# Patient Record
Sex: Female | Born: 1940 | Race: White | Hispanic: No | Smoking: Never smoker
Health system: Southern US, Community
[De-identification: ages and names within clinical notes are randomized; demographics above are authoritative.]

## PROBLEM LIST (undated history)

## (undated) DIAGNOSIS — K219 Gastro-esophageal reflux disease without esophagitis: Secondary | ICD-10-CM

## (undated) DIAGNOSIS — I499 Cardiac arrhythmia, unspecified: Secondary | ICD-10-CM

## (undated) DIAGNOSIS — E1165 Type 2 diabetes mellitus with hyperglycemia: Secondary | ICD-10-CM

## (undated) DIAGNOSIS — N816 Rectocele: Secondary | ICD-10-CM

## (undated) DIAGNOSIS — C801 Malignant (primary) neoplasm, unspecified: Secondary | ICD-10-CM

## (undated) DIAGNOSIS — M1990 Unspecified osteoarthritis, unspecified site: Secondary | ICD-10-CM

## (undated) DIAGNOSIS — I10 Essential (primary) hypertension: Secondary | ICD-10-CM

## (undated) HISTORY — PX: PUNCH BIOPSY OF SKIN: SHX6390

## (undated) HISTORY — PX: LAPAROSCOPIC HYSTERECTOMY: SHX1926

## (undated) HISTORY — PX: CHOLECYSTECTOMY: SHX55

## (undated) HISTORY — PX: TUBAL LIGATION: SHX77

## (undated) HISTORY — PX: CATARACT EXTRACTION, BILATERAL: SHX1313

## (undated) HISTORY — PX: ABDOMINAL HYSTERECTOMY: SHX81

## (undated) HISTORY — PX: APPENDECTOMY: SHX54

## (undated) HISTORY — DX: Type 2 diabetes mellitus with hyperglycemia: E11.65

---

## 1968-05-12 HISTORY — PX: APPENDECTOMY: SHX54

## 1976-05-12 HISTORY — PX: CHOLECYSTECTOMY: SHX55

## 1978-05-12 HISTORY — PX: TUBAL LIGATION: SHX77

## 2002-05-12 HISTORY — PX: CATARACT EXTRACTION, BILATERAL: SHX1313

## 2009-11-22 ENCOUNTER — Ambulatory Visit: Payer: Self-pay | Admitting: Internal Medicine

## 2009-11-29 ENCOUNTER — Ambulatory Visit: Payer: Self-pay | Admitting: Internal Medicine

## 2010-01-30 ENCOUNTER — Ambulatory Visit: Payer: Self-pay | Admitting: Unknown Physician Specialty

## 2010-12-02 ENCOUNTER — Ambulatory Visit: Payer: Self-pay | Admitting: Internal Medicine

## 2013-08-02 DIAGNOSIS — C44310 Basal cell carcinoma of skin of unspecified parts of face: Secondary | ICD-10-CM | POA: Insufficient documentation

## 2017-05-12 DIAGNOSIS — N816 Rectocele: Secondary | ICD-10-CM

## 2017-05-12 HISTORY — DX: Rectocele: N81.6

## 2017-06-29 ENCOUNTER — Telehealth: Payer: Self-pay | Admitting: Family Medicine

## 2017-06-29 NOTE — Telephone Encounter (Signed)
Per chart, pt contacted office and scheduled 11min appt

## 2017-06-29 NOTE — Telephone Encounter (Signed)
Lm on pts vm requesting a call back to schedule 62min appt

## 2017-06-29 NOTE — Telephone Encounter (Signed)
Attempted to reach patient. Left a message asking her to call office.

## 2017-06-29 NOTE — Telephone Encounter (Signed)
Please call patient and schedule her for a 30 minute appointment this week, have her keep her establish care appointment for next month.

## 2017-06-29 NOTE — Telephone Encounter (Signed)
Pt came in to schedule new patient appt. The first thing I was able to schedule is 3/8. She says her insurance sent out a nurse to give her a check up and says she has an irregular heartbeat. She wants her to see someone as soon as possible. Is there any way we could get her in sooner?

## 2017-07-01 ENCOUNTER — Ambulatory Visit (INDEPENDENT_AMBULATORY_CARE_PROVIDER_SITE_OTHER): Payer: Medicare Other | Admitting: Family Medicine

## 2017-07-01 ENCOUNTER — Encounter: Payer: Self-pay | Admitting: Family Medicine

## 2017-07-01 VITALS — BP 122/76 | HR 95 | Temp 98.0°F | Wt 170.5 lb

## 2017-07-01 DIAGNOSIS — E559 Vitamin D deficiency, unspecified: Secondary | ICD-10-CM

## 2017-07-01 DIAGNOSIS — Z23 Encounter for immunization: Secondary | ICD-10-CM | POA: Diagnosis not present

## 2017-07-01 DIAGNOSIS — Z7689 Persons encountering health services in other specified circumstances: Secondary | ICD-10-CM

## 2017-07-01 DIAGNOSIS — I499 Cardiac arrhythmia, unspecified: Secondary | ICD-10-CM

## 2017-07-01 NOTE — Progress Notes (Signed)
Subjective:    Patient ID: Claire Drake, female    DOB: [birth year], 77 y.o.   MRN: [id number]  HPI This is a 77 yo female who presents today to establish care. No regular care for 7-8 years.  She is a widow and lives alone. 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 Retired in 2005- worked at [employer]. Volunteers in [employer]. Involved in church. Picks up 26 yo grandson twice a week.   She has not had any care in many years. Presents today because she had a home nurse visit from [facility] who told her that her heart beat was irregular. She has not had any chest pain, SOB, she walks several miles while volunteering each week without difficulty. Since being told she has an irregular heart beat, she thinks she has noticed some skipped beats occasionally.   [facility] nurse also told her that her LE circulation was decreased following a test. Patient denies any numbness, tingling, pain, fatigue, swelling, redness, temperature change in her legs.   Last CPE- many years Mammo- declines screening due to age Pap- NA Colonoscopy- NA Tdap- unknown Flu- [facility] at [facility] center Eye- not recently Dental- regular Exercise- yard work, some walking    No past medical history on file.  Past Surgical History:  Procedure Laterality Date  . APPENDECTOMY    . CHOLECYSTECTOMY     Family History  Problem Relation Age of Onset  . Dementia Mother   . Heart disease Father   . Heart attack Father   . Diabetes Maternal Grandmother   . Heart attack Maternal Grandfather    Social History   Tobacco Use  . Smoking status: Never Smoker  . Smokeless tobacco: Never Used  Substance Use Topics  . Alcohol use: No    Frequency: Never  . Drug use: No        Review of Systems  Constitutional: Negative for fever.  Respiratory: Negative for cough, chest tightness and shortness of breath.   Cardiovascular: Positive for  palpitations (Thinks she has noticed since she was told her HR was irregular). Negative for chest pain and leg swelling.  Genitourinary: Positive for vaginal discharge (small amount clear). Negative for vaginal bleeding.  Neurological: Negative for dizziness and light-headedness.       Objective:   Physical Exam  Constitutional: She is oriented to person, place, and time. No distress.  HENT:  Head: Atraumatic.  Mouth/Throat: Oropharynx is clear and moist.  Eyes: Conjunctivae are normal.  Neck: Normal range of motion. Neck supple. No thyromegaly present.  Cardiovascular: Normal rate and normal heart sounds. An irregularly irregular rhythm present.  Pulmonary/Chest: Effort normal and breath sounds normal.  Musculoskeletal: Normal range of motion. She exhibits no edema.  Lymphadenopathy:    She has no cervical adenopathy.  Neurological: She is alert and oriented to person, place, and time.  Skin: Skin is warm and dry. She is not diaphoretic.  Psychiatric: She has a normal mood and affect. Her behavior is normal. Judgment and thought content normal.  Vitals reviewed.     BP 122/76   Pulse 95   Temp 98 F (36.7 C) (Oral)   Wt 170 lb 8 oz (77.3 kg)   SpO2 99%     EKG- reviewed with Dr. Danise Mina- SR rate 78 with PAC x 2   Assessment & Plan:  1. Encounter to establish care - Will follow up in  1 month for CPE/AWV  2. Irregular heart beat - will check labs, RTC/ER precautions reviewed - EKG 12-Lead - CBC with Differential - Comprehensive metabolic panel - TSH - VITAMIN D 25 Hydroxy (Vit-D Deficiency, Fractures) - HgB A1c - POCT UA - Microscopic Only - Magnesium  3. Need for vaccination with 13-polyvalent pneumococcal conjugate vaccine - Pneumococcal conjugate vaccine 13-valent   Clarene Reamer, FNP-BC  [place] [facility], [facility]  07/09/2017 1:15 PM

## 2017-07-01 NOTE — Patient Instructions (Signed)
It was good to meet you today.  Please schedule your complete physical exam for 1 month  I will call you with your lab results  If you have any concerns in the meantime, please give me a call

## 2017-07-02 LAB — COMPREHENSIVE METABOLIC PANEL
ALT: 15 U/L (ref 0–35)
AST: 18 U/L (ref 0–37)
Albumin: 4.2 g/dL (ref 3.5–5.2)
Alkaline Phosphatase: 70 U/L (ref 39–117)
BUN: 11 mg/dL (ref 6–23)
CO2: 30 meq/L (ref 19–32)
Calcium: 9.9 mg/dL (ref 8.4–10.5)
Chloride: 101 mEq/L (ref 96–112)
Creatinine, Ser: 0.69 mg/dL (ref 0.40–1.20)
GFR: 87.5 mL/min (ref >60.00)
GLUCOSE: 166 mg/dL — AB (ref 70–99)
POTASSIUM: 4.2 meq/L (ref 3.5–5.1)
Sodium: 138 mEq/L (ref 135–145)
TOTAL PROTEIN: 7 g/dL (ref 6.0–8.3)
Total Bilirubin: 0.5 mg/dL (ref 0.2–1.2)

## 2017-07-02 LAB — CBC WITH DIFFERENTIAL/PLATELET
Basophils Absolute: 0.1 10*3/uL (ref 0.0–0.1)
Basophils Relative: 0.9 % (ref 0.0–3.0)
EOS PCT: 2.8 % (ref 0.0–5.0)
Eosinophils Absolute: 0.2 10*3/uL (ref 0.0–0.7)
HCT: 46 % (ref 36.0–46.0)
Hemoglobin: 15.4 g/dL — ABNORMAL HIGH (ref 12.0–15.0)
LYMPHS ABS: 1.4 10*3/uL (ref 0.7–4.0)
Lymphocytes Relative: 18.9 % (ref 12.0–46.0)
MCHC: 33.5 g/dL (ref 30.0–36.0)
MCV: 94.1 fl (ref 78.0–100.0)
MONOS PCT: 8.1 % (ref 3.0–12.0)
Monocytes Absolute: 0.6 10*3/uL (ref 0.1–1.0)
NEUTROS ABS: 5.2 10*3/uL (ref 1.4–7.7)
NEUTROS PCT: 69.3 % (ref 43.0–77.0)
PLATELETS: 226 10*3/uL (ref 150.0–400.0)
RBC: 4.89 Mil/uL (ref 3.87–5.11)
RDW: 13 % (ref 11.5–15.5)
WBC: 7.4 10*3/uL (ref 4.0–10.5)

## 2017-07-02 LAB — VITAMIN D 25 HYDROXY (VIT D DEFICIENCY, FRACTURES): VITD: 16.86 ng/mL — ABNORMAL LOW (ref 30.00–100.00)

## 2017-07-02 LAB — MAGNESIUM: Magnesium: 1.8 mg/dL (ref 1.5–2.5)

## 2017-07-02 LAB — TSH: TSH: 2.28 u[IU]/mL (ref 0.35–4.50)

## 2017-07-09 ENCOUNTER — Encounter: Payer: Self-pay | Admitting: Family Medicine

## 2017-07-09 MED ORDER — VITAMIN D3 1.25 MG (50000 UT) PO TABS: 1.0000 | ORAL_TABLET | ORAL | 1 refills | Status: DC

## 2017-07-17 ENCOUNTER — Ambulatory Visit: Payer: Self-pay | Admitting: Family Medicine

## 2017-07-27 ENCOUNTER — Other Ambulatory Visit: Payer: Self-pay | Admitting: Family Medicine

## 2017-07-27 DIAGNOSIS — R739 Hyperglycemia, unspecified: Secondary | ICD-10-CM

## 2017-07-27 DIAGNOSIS — R7309 Other abnormal glucose: Secondary | ICD-10-CM

## 2017-07-29 ENCOUNTER — Ambulatory Visit: Payer: Medicare Other

## 2017-07-29 ENCOUNTER — Ambulatory Visit (INDEPENDENT_AMBULATORY_CARE_PROVIDER_SITE_OTHER): Payer: Medicare Other

## 2017-07-29 VITALS — BP 118/82 | HR 67 | Temp 97.6°F | Ht 60.5 in | Wt 164.8 lb

## 2017-07-29 DIAGNOSIS — Z0000 Encounter for general adult medical examination without abnormal findings: Secondary | ICD-10-CM

## 2017-07-29 DIAGNOSIS — R739 Hyperglycemia, unspecified: Secondary | ICD-10-CM | POA: Diagnosis not present

## 2017-07-29 DIAGNOSIS — R7309 Other abnormal glucose: Secondary | ICD-10-CM | POA: Diagnosis not present

## 2017-07-29 LAB — URINALYSIS, ROUTINE W REFLEX MICROSCOPIC
BILIRUBIN URINE: NEGATIVE
HGB URINE DIPSTICK: NEGATIVE
Ketones, ur: 40 — AB
NITRITE: NEGATIVE
Specific Gravity, Urine: 1.02 (ref 1.000–1.030)
Total Protein, Urine: NEGATIVE
Urine Glucose: NEGATIVE
Urobilinogen, UA: 0.2 (ref 0.0–1.0)
pH: 5.5 (ref 5.0–8.0)

## 2017-07-29 LAB — HEMOGLOBIN A1C: Hgb A1c MFr Bld: 8.7 % — ABNORMAL HIGH (ref 4.6–6.5)

## 2017-07-29 NOTE — Progress Notes (Signed)
Subjective:   Claire Drake is a 77 y.o. female who presents for an Initial Medicare Annual Wellness Visit.  Review of Systems    N/A  Cardiac Risk Factors include: advanced age (>19men, >32 women);obesity (BMI >30kg/m2)     Objective:    Today's Vitals   07/29/17 1122  BP: 118/82  Pulse: 67  Temp: 97.6 F (36.4 C)  TempSrc: Oral  SpO2: 98%  Weight: 164 lb 12 oz (74.7 kg)  Height: 5' 0.5" (1.537 m)  PainSc: 0-No pain   Body mass index is 31.65 kg/m.  Advanced Directives 07/29/2017  Does Patient Have a Medical Advance Directive? Yes  Type of Paramedic of [place];Living will  Copy of [place] in Chart? No - copy requested    Current Medications (verified) Outpatient Encounter Medications as of 07/29/2017  Medication Sig  . aspirin 81 MG chewable tablet Chew by mouth daily.  . Cholecalciferol (VITAMIN D3) 50000 units TABS Take 1 tablet by mouth every 7 (seven) days.   No facility-administered encounter medications on file as of 07/29/2017.     Allergies (verified) Patient has no known allergies.   History: History reviewed. No pertinent past medical history. Past Surgical History:  Procedure Laterality Date  . APPENDECTOMY    . CATARACT EXTRACTION, BILATERAL  05/12/2002  . CHOLECYSTECTOMY     Family History  Problem Relation Age of Onset  . Dementia Mother   . Heart disease Father   . Heart attack Father   . Diabetes Maternal Grandmother   . Heart attack Maternal Grandfather    Social History   Socioeconomic History  . Marital status: Widowed    Spouse name: None  . Number of children: None  . Years of education: None  . Highest education level: None  Social Needs  . Financial resource strain: None  . Food insecurity - worry: None  . Food insecurity - inability: None  . Transportation needs - medical: None  . Transportation needs - non-medical: None  Occupational History  . None  Tobacco Use  .  Smoking status: Never Smoker  . Smokeless tobacco: Never Used  Substance and Sexual Activity  . Alcohol use: No    Frequency: Never  . Drug use: No  . Sexual activity: No  Other Topics Concern  . None  Social History Narrative  . None    Tobacco Counseling Counseling given: No   Clinical Intake:  Pre-visit preparation completed: Yes  Pain : No/denies pain Pain Score: 0-No pain     Nutritional Status: BMI > 30  Obese Nutritional Risks: None Diabetes: No  How often do you need to have someone help you when you read instructions, pamphlets, or other written materials from your doctor or pharmacy?: 1 - Never What is the last grade level you completed in school?: Masters degree + PhD courses  Interpreter Needed?: No  Comments: pt is a widow and lives alone Information entered by :: LPinson, LPN   Activities of Daily Living In your present state of health, do you have any difficulty performing the following activities: 07/29/2017  Hearing? N  Vision? N  Difficulty concentrating or making decisions? N  Walking or climbing stairs? N  Dressing or bathing? N  Doing errands, shopping? N  Preparing Food and eating ? N  Using the Toilet? N  In the past six months, have you accidently leaked urine? N  Do you have problems with loss of bowel control? N  Managing your Medications? N  Managing your Finances? N  Housekeeping or managing your Housekeeping? N  Some recent data might be hidden     Immunizations and Health Maintenance Immunization History  Administered Date(s) Administered  . Influenza,inj,Quad PF,6+ Mos 02/09/2017  . Pneumococcal Conjugate-13 07/01/2017   There are no preventive care reminders to display for this patient.  Patient Care Team: Elby Beck, FNP as PCP - General (Nurse Practitioner)  Indicate any recent Medical Services you may have received from other than [facility] providers in the past year (date may be approximate).     Assessment:    This is a routine wellness examination for [place].  Hearing/Vision screen  Hearing Screening   125Hz  250Hz  500Hz  1000Hz  2000Hz  3000Hz  4000Hz  6000Hz  8000Hz   Right ear:   40 0 40  0    Left ear:   40 0 40  0      Visual Acuity Screening   Right eye Left eye Both eyes  Without correction: 20/50-1 20/30 20/20  With correction:       Dietary issues and exercise activities discussed: Current Exercise Habits: Home exercise routine, Type of exercise: walking, Time (Minutes): 20, Frequency (Times/Week): 7, Weekly Exercise (Minutes/Week): 140, Intensity: Moderate, Exercise limited by: None identified  Goals    . Increase physical activity     Starting 07/29/2017, I will continue to walk for 20 minutes daily.      Depression Screen PHQ 2/9 Scores 07/29/2017 07/01/2017  PHQ - 2 Score 0 0  PHQ- 9 Score 0 -    Fall Risk Fall Risk  07/29/2017 07/01/2017  Falls in the past year? Yes Yes  Number falls in past yr: 2 or more 2 or more  Injury with Fall? Yes -   Cognitive Function: MMSE - Mini Mental State Exam 07/29/2017  Orientation to time 5  Orientation to Place 5  Registration 3  Attention/ Calculation 0  Recall 3  Language- name 2 objects 0  Language- repeat 1  Language- follow 3 step command 3  Language- read & follow direction 0  Write a sentence 0  Copy design 0  Total score 20     PLEASE NOTE: A Mini-Cog screen was completed. Maximum score is 20. A value of 0 denotes this part of [place] MMSE was not completed or the patient failed this part of the Mini-Cog screening.   Mini-Cog Screening Orientation to Time - Max 5 pts Orientation to Place - Max 5 pts Registration - Max 3 pts Recall - Max 3 pts Language Repeat - Max 1 pts Language Follow 3 Step Command - Max 3 pts     Screening Tests Health Maintenance  Topic Date Due  . TETANUS/TDAP  07/30/2018 (Originally 09/12/1959)  . PNA vac Low Risk Adult (2 of 2 - PPSV23) 07/01/2018  . INFLUENZA VACCINE  Completed  . DEXA  SCAN  Completed      Plan:     I have personally reviewed, addressed, and noted the following in the patient's chart:  A. Medical and social history B. Use of alcohol, tobacco or illicit drugs  C. Current medications and supplements D. Functional ability and status E.  Nutritional status F.  Physical activity G. Advance directives H. List of other physicians I.  Hospitalizations, surgeries, and ER visits in previous 12 months J.  [place] to include hearing, vision, cognitive, depression L. Referrals and appointments - none  In addition, I have reviewed and discussed with patient certain preventive  protocols, quality metrics, and best practice recommendations. A written personalized care plan for preventive services as well as general preventive health recommendations were provided to patient.  See attached scanned questionnaire for additional information.   Signed,   Lindell Noe, MHA, BS, LPN Health Coach

## 2017-07-29 NOTE — Progress Notes (Signed)
I reviewed health advisor's note, was available for consultation, and agree with documentation and plan.  

## 2017-07-29 NOTE — Progress Notes (Signed)
PCP notes:   Health maintenance:  Tetanus vaccine - postponed/insurance  Abnormal screenings:   Hearing - failed  Hearing Screening   125Hz  250Hz  500Hz  1000Hz  2000Hz  3000Hz  4000Hz  6000Hz  8000Hz   Right ear:   40 0 40  0    Left ear:   40 0 40  0     Fall risk - hx of mutliple falls Fall Risk  07/29/2017 07/01/2017  Falls in the past year? Yes Yes  Number falls in past yr: 2 or more 2 or more  Injury with Fall? Yes -   Patient concerns:   None  Nurse concerns:  None  Next PCP appt:   08/05/2017 @ 0830

## 2017-07-29 NOTE — Patient Instructions (Signed)
Claire Drake , Thank you for taking time to come for your Medicare Wellness Visit. I appreciate your ongoing commitment to your health goals. Please review the following plan we discussed and let me know if I can assist you in the future.   These are the goals we discussed: Goals    . Increase physical activity     Starting 07/29/2017, I will continue to walk for 20 minutes daily.       This is a list of the screening recommended for you and due dates:  Health Maintenance  Topic Date Due  . Tetanus Vaccine  07/30/2018*  . Pneumonia vaccines (2 of 2 - PPSV23) 07/01/2018  . Flu Shot  Completed  . DEXA scan (bone density measurement)  Completed  *Topic was postponed. The date shown is not the original due date.   Preventive Care for Adults  A healthy lifestyle and preventive care can promote health and wellness. Preventive health guidelines for adults include the following key practices.  . A routine yearly physical is a good way to check with your health care provider about your health and preventive screening. It is a chance to share any concerns and updates on your health and to receive a thorough exam.  . Visit your dentist for a routine exam and preventive care every 6 months. Brush your teeth twice a day and floss once a day. Good oral hygiene prevents tooth decay and gum disease.  . The frequency of eye exams is based on your age, health, family medical history, use  of contact lenses, and other factors. Follow your health care provider's recommendations for frequency of eye exams.  . Eat a healthy diet. Foods like vegetables, fruits, whole grains, low-fat dairy products, and lean protein foods contain the nutrients you need without too many calories. Decrease your intake of foods high in solid fats, added sugars, and salt. Eat the right amount of calories for you. Get information about a proper diet from your health care provider, if necessary.  . Regular physical exercise is one of  the most important things you can do for your health. Most adults should get at least 150 minutes of moderate-intensity exercise (any activity that increases your heart rate and causes you to sweat) each week. In addition, most adults need muscle-strengthening exercises on 2 or more days a week.  Silver Sneakers may be a benefit available to you. To determine eligibility, you may visit the website: www.silversneakers.com or contact program at 3642193924 Mon-Fri between 8AM-8PM.   . Maintain a healthy weight. The body mass index (BMI) is a screening tool to identify possible weight problems. It provides an estimate of body fat based on height and weight. Your health care provider can find your BMI and can help you achieve or maintain a healthy weight.   For adults 20 years and older: ? A BMI below 18.5 is considered underweight. ? A BMI of 18.5 to 24.9 is normal. ? A BMI of 25 to 29.9 is considered overweight. ? A BMI of 30 and above is considered obese.   . Maintain normal blood lipids and cholesterol levels by exercising and minimizing your intake of saturated fat. Eat a balanced diet with plenty of fruit and vegetables. Blood tests for lipids and cholesterol should begin at age 55 and be repeated every 5 years. If your lipid or cholesterol levels are high, you are over 50, or you are at high risk for heart disease, you may need your cholesterol levels  checked more frequently. Ongoing high lipid and cholesterol levels should be treated with medicines if diet and exercise are not working.  . If you smoke, find out from your health care provider how to quit. If you do not use tobacco, please do not start.  . If you choose to drink alcohol, please do not consume more than 2 drinks per day. One drink is considered to be 12 ounces (355 mL) of beer, 5 ounces (148 mL) of wine, or 1.5 ounces (44 mL) of liquor.  . If you are 36-16 years old, ask your health care provider if you should take aspirin to  prevent strokes.  . Use sunscreen. Apply sunscreen liberally and repeatedly throughout the day. You should seek shade when your shadow is shorter than you. Protect yourself by wearing long sleeves, pants, a wide-brimmed hat, and sunglasses year round, whenever you are outdoors.  . Once a month, do a whole body skin exam, using a mirror to look at the skin on your back. Tell your health care provider of new moles, moles that have irregular borders, moles that are larger than a pencil eraser, or moles that have changed in shape or color.

## 2017-07-30 LAB — LIPID PANEL
CHOL/HDL RATIO: 3
CHOLESTEROL: 184 mg/dL (ref 0–200)
HDL: 60.2 mg/dL (ref >39.00)
LDL Cholesterol: 107 mg/dL — ABNORMAL HIGH (ref 0–99)
NonHDL: 123.44
TRIGLYCERIDES: 81 mg/dL (ref 0.0–149.0)
VLDL: 16.2 mg/dL (ref 0.0–40.0)

## 2017-08-05 ENCOUNTER — Ambulatory Visit (INDEPENDENT_AMBULATORY_CARE_PROVIDER_SITE_OTHER): Payer: Medicare Other | Admitting: Family Medicine

## 2017-08-05 ENCOUNTER — Other Ambulatory Visit (HOSPITAL_COMMUNITY)
Admission: RE | Admit: 2017-08-05 | Discharge: 2017-08-05 | Disposition: A | Discharge status: Home / Self Care | Payer: Medicare Other | Source: Ambulatory Visit | Attending: Family Medicine | Admitting: Family Medicine

## 2017-08-05 ENCOUNTER — Encounter: Payer: Self-pay | Admitting: Family Medicine

## 2017-08-05 VITALS — BP 132/62 | HR 78 | Temp 97.7°F | Wt 168.8 lb

## 2017-08-05 DIAGNOSIS — Z0000 Encounter for general adult medical examination without abnormal findings: Secondary | ICD-10-CM | POA: Diagnosis not present

## 2017-08-05 DIAGNOSIS — Z124 Encounter for screening for malignant neoplasm of cervix: Secondary | ICD-10-CM

## 2017-08-05 DIAGNOSIS — E1165 Type 2 diabetes mellitus with hyperglycemia: Secondary | ICD-10-CM | POA: Diagnosis not present

## 2017-08-05 DIAGNOSIS — N898 Other specified noninflammatory disorders of vagina: Secondary | ICD-10-CM | POA: Insufficient documentation

## 2017-08-05 DIAGNOSIS — R8290 Unspecified abnormal findings in urine: Secondary | ICD-10-CM

## 2017-08-05 LAB — POC URINALSYSI DIPSTICK (AUTOMATED)
Bilirubin, UA: NEGATIVE
Blood, UA: NEGATIVE
Glucose, UA: NEGATIVE
KETONES UA: NEGATIVE
Leukocytes, UA: NEGATIVE
Nitrite, UA: NEGATIVE
PH UA: 6 (ref 5.0–8.0)
PROTEIN UA: POSITIVE
SPEC GRAV UA: 1.02 (ref 1.010–1.025)
Urobilinogen, UA: 0.2 E.U./dL

## 2017-08-05 MED ORDER — METFORMIN HCL 500 MG PO TABS: ORAL_TABLET | ORAL | 3 refills | Status: DC

## 2017-08-05 NOTE — Progress Notes (Signed)
Subjective:    Patient ID: Claire Drake, female    DOB: 1940/12/04, 77 y.o.   MRN: [id number]  HPI This is a 77 yo female who presents today for CPE, part 2. Had AWV 07/29/17. She volunteers at the [facility].   Decreased hearing on screening- not interfering with daily activities.   Diabetes- hemoglobin A1C 8.7, has been elevated in past. Did not watch her diet very carefully over the holidays. Is agreeable to starting medication. Has been increasing exercise recently.   Vaginal discharge- for about 5 months, not itchy, clear to white. Has to wear a pad daily. Not sexually active for many years. No abdominal pain, nausea, vomiting or change in bowel pattern.   Abnormal Urine on dip- + ketones, positive WBCs- no dysuria or frequency    Last CPE- many years Mammo- declines screening due to age 83 Colonoscopy- declines screening Tdap-unknown, declines due to finances Flu- annual Eye- over due Dental- regular Exercise- has recently increased frequency of walking to most days of the week   No past medical history on file. Past Surgical History:  Procedure Laterality Date  . APPENDECTOMY    . CATARACT EXTRACTION, BILATERAL  05/12/2002  . CHOLECYSTECTOMY     Family History  Problem Relation Age of Onset  . Dementia Mother   . Heart disease Father   . Heart attack Father   . Diabetes Maternal Grandmother   . Heart attack Maternal Grandfather    Social History   Tobacco Use  . Smoking status: Never Smoker  . Smokeless tobacco: Never Used  Substance Use Topics  . Alcohol use: No    Frequency: Never  . Drug use: No      Review of Systems  Constitutional: Negative.   HENT: Negative.   Eyes: Negative.   Cardiovascular: Negative.   Gastrointestinal: Negative.   Genitourinary: Positive for vaginal discharge. Negative for dysuria, frequency, pelvic pain, vaginal bleeding and vaginal pain.  Musculoskeletal:       Occasional pain and stiffness of knees, back.  Doesn't usually take anything for symptoms.   Allergic/Immunologic: Negative.   Neurological: Negative.   Psychiatric/Behavioral: Negative for dysphoric mood. The patient is not nervous/anxious.        Objective:   Physical Exam  Constitutional: She is oriented to person, place, and time. She appears well-developed and well-nourished. No distress.  HENT:  Head: Normocephalic and atraumatic.  Eyes: Conjunctivae are normal.  Neck: Normal range of motion. Neck supple.  Cardiovascular: Normal rate, regular rhythm, normal heart sounds and intact distal pulses.  Pulmonary/Chest: Effort normal and breath sounds normal. Right breast exhibits no inverted nipple, no mass, no nipple discharge, no skin change and no tenderness. Left breast exhibits no inverted nipple, no mass, no nipple discharge, no skin change and no tenderness.  Abdominal: Soft. Bowel sounds are normal. She exhibits no distension. There is no tenderness. There is no rebound and no guarding.  Genitourinary: Pelvic exam was performed with patient supine. There is no rash, tenderness, lesion or injury on the right labia. There is no rash, tenderness, lesion or injury on the left labia. Cervix exhibits discharge. Cervix exhibits no motion tenderness and no friability. Right adnexum displays no mass, no tenderness and no fullness. Left adnexum displays no mass, no tenderness and no fullness. Vaginal discharge found.  Genitourinary Comments: Patient with moderate amount clear mucoid discharge. Cervix slightly dilated (fingertip). PAP obtained.   Musculoskeletal: She exhibits no edema.  Lymphadenopathy:    She  has no cervical adenopathy.  Neurological: She is alert and oriented to person, place, and time.  Skin: Skin is warm and dry. She is not diaphoretic.  Psychiatric: She has a normal mood and affect. Her behavior is normal. Judgment and thought content normal.  Vitals reviewed.    BP 132/62   Pulse 78   Temp 97.7 F (36.5 C)  (Oral)   Wt 168 lb 12 oz (76.5 kg)   SpO2 99%   BMI 32.41 kg/m  Wt Readings from Last 3 Encounters:  08/05/17 168 lb 12 oz (76.5 kg)  07/29/17 164 lb 12 oz (74.7 kg)  07/01/17 170 lb 8 oz (77.3 kg)   Depression screen [facility] 2/9 07/29/2017 07/01/2017  Decreased Interest 0 0  Down, Depressed, Hopeless 0 0  PHQ - 2 Score 0 0  Altered sleeping 0 -  Tired, decreased energy 0 -  Change in appetite 0 -  Feeling bad or failure about yourself  0 -  Trouble concentrating 0 -  Moving slowly or fidgety/restless 0 -  Suicidal thoughts 0 -  PHQ-9 Score 0 -  Difficult doing work/chores Not difficult at all -        Assessment & Plan:  1. Annual physical exam - Discussed and encouraged healthy lifestyle choices- adequate sleep, regular exercise, stress management and healthy food choices.   2. Uncontrolled type 2 diabetes mellitus with hyperglycemia - Provided written and verbal information regarding diagnosis and treatment. - will start low dose metformin and discussed importance of healthy food choices for weight loss and increased exercise - metFORMIN (GLUCOPHAGE) 500 MG tablet; Take 1/2 tablet with dinner for 7 days, then take 1 tablet daily with dinner  Dispense: 90 tablet; Refill: 3  3. Vaginal discharge - Will check PAP and will likely need pelvic US - Cytology - PAP([facility])  4. Screening for cervical cancer - Cytology - PAP([place])  5. Abnormal urine - POCT Urinalysis Dipstick (Automated)   Clarene Reamer, FNP-BC   Primary Care at [facility], [facility]  08/06/2017 8:57 AM

## 2017-08-05 NOTE — Patient Instructions (Signed)
Good to see you today!  I have sent metformin to your pharmacy, start 1/2 tablet with dinner for 7 days then take a whole table. Let me know if you develop stomach problems or have questions.  I am sending pap smear to check for yeast, bacteria and abnormal cervical cells  Follow up in 3-4 months to check your labs     Keeping You Healthy  Get These Tests  Blood Pressure- Have your blood pressure checked by your healthcare provider at least once a year.  Normal blood pressure is 120/80.  Weight- Have your body mass index (BMI) calculated to screen for obesity.  BMI is a measure of body fat based on height and weight.  You can calculate your own BMI at [employer].it  Cholesterol- Have your cholesterol checked every year.  Diabetes- Have your blood sugar checked every year if you have high blood pressure, high cholesterol, a family history of diabetes or if you are overweight.  Pap Test - Have a pap test every 1 to 5 years if you have been sexually active.  If you are older than 65 and recent pap tests have been normal you may not need additional pap tests.  In addition, if you have had a hysterectomy  for benign disease additional pap tests are not necessary.  Mammogram-Yearly mammograms are essential for early detection of breast cancer  Screening for Colon Cancer- Colonoscopy starting at age 16. Screening may begin sooner depending on your family history and other health conditions.  Follow up colonoscopy as directed by your Gastroenterologist.  Screening for Osteoporosis- Screening begins at age 53 with bone density scanning, sooner if you are at higher risk for developing Osteoporosis.  Get these medicines  Calcium with Vitamin D- Your body requires 1200-1500 mg of Calcium a day and [phone] IU of Vitamin D a day.  You can only absorb 500 mg of Calcium at a time therefore Calcium must be taken in 2 or 3 separate doses throughout the day.  Hormones- Hormone therapy  has been associated with increased risk for certain cancers and heart disease.  Talk to your healthcare provider about if you need relief from menopausal symptoms.  Aspirin- Ask your healthcare provider about taking Aspirin to prevent Heart Disease and Stroke.  Get these Immuniztions  Flu shot- Every fall  Pneumonia shot- Once after the age of 46; if you are younger ask your healthcare provider if you need a pneumonia shot.  Tetanus- Every ten years.  Zostavax- Once after the age of 74 to prevent shingles.  Take these steps  Don't smoke- Your healthcare provider can help you quit. For tips on how to quit, ask your healthcare provider or go to www.smokefree.gov or call 1-800 QUIT-NOW.  Be physically active- Exercise 5 days a week for a minimum of 30 minutes.  If you are not already physically active, start slow and gradually work up to 30 minutes of moderate physical activity.  Try walking, dancing, bike riding, swimming, etc.  Eat a healthy diet- Eat a variety of healthy foods such as fruits, vegetables, whole grains, low fat milk, low fat cheeses, yogurt, lean meats, chicken, fish, eggs, dried beans, tofu, etc.  For more information go to www.thenutritionsource.org  Dental visit- Brush and floss teeth twice daily; visit your dentist twice a year.  Eye exam- Visit your Optometrist or Ophthalmologist yearly.  Drink alcohol in moderation- Limit alcohol intake to one drink or less a day.  Never drink and drive.  Depression- Your  emotional health is as important as your physical health.  If you're feeling down or losing interest in things you normally enjoy, please talk to your healthcare provider.  Seat Belts- can save your life; always wear one  Smoke/Carbon Monoxide detectors- These detectors need to be installed on the appropriate level of your home.  Replace batteries at least once a year.  Violence- If anyone is threatening or hurting you, please tell your healthcare  provider.  Living Will/ Health care power of attorney- Discuss with your healthcare provider and family.

## 2017-08-06 ENCOUNTER — Encounter: Payer: Self-pay | Admitting: Family Medicine

## 2017-08-06 DIAGNOSIS — E1165 Type 2 diabetes mellitus with hyperglycemia: Secondary | ICD-10-CM | POA: Insufficient documentation

## 2017-08-06 HISTORY — DX: Type 2 diabetes mellitus with hyperglycemia: E11.65

## 2017-08-07 LAB — CYTOLOGY - PAP
BACTERIAL VAGINITIS: NEGATIVE
CANDIDA VAGINITIS: NEGATIVE
DIAGNOSIS: NEGATIVE
HPV: NOT DETECTED

## 2017-08-10 DIAGNOSIS — I10 Essential (primary) hypertension: Secondary | ICD-10-CM

## 2017-08-10 DIAGNOSIS — C801 Malignant (primary) neoplasm, unspecified: Secondary | ICD-10-CM

## 2017-08-10 HISTORY — DX: Malignant (primary) neoplasm, unspecified: C80.1

## 2017-08-10 HISTORY — DX: Essential (primary) hypertension: I10

## 2017-08-11 ENCOUNTER — Other Ambulatory Visit: Payer: Self-pay | Admitting: Family Medicine

## 2017-08-11 DIAGNOSIS — N898 Other specified noninflammatory disorders of vagina: Secondary | ICD-10-CM

## 2017-08-13 ENCOUNTER — Ambulatory Visit: Payer: Medicare Other | Admitting: Obstetrics and Gynecology

## 2017-08-13 ENCOUNTER — Encounter: Payer: Self-pay | Admitting: Obstetrics and Gynecology

## 2017-08-13 VITALS — BP 175/90 | HR 72 | Wt 168.0 lb

## 2017-08-13 DIAGNOSIS — N898 Other specified noninflammatory disorders of vagina: Secondary | ICD-10-CM | POA: Diagnosis not present

## 2017-08-13 DIAGNOSIS — N816 Rectocele: Secondary | ICD-10-CM | POA: Diagnosis not present

## 2017-08-13 DIAGNOSIS — R030 Elevated blood-pressure reading, without diagnosis of hypertension: Secondary | ICD-10-CM

## 2017-08-13 NOTE — Procedures (Signed)
Endometrial Biopsy Procedure Note  Pre-operative Diagnosis: Vaginal discharge. postmenopausal  Post-operative Diagnosis: same  Procedure Details  The risks (including infection, bleeding, pain, and uterine perforation) and benefits of the procedure were explained to the patient and Written informed consent was obtained.  The patient was placed in the dorsal lithotomy position.  Bimanual exam showed the uterus to be in the neutral position.  A Graves' speculum inserted in the vagina, and the cervix was visualized.. The cervix was then prepped with povidone iodine, and a sharp tenaculum was applied to the anterior lip of the cervix for stabilization.  A pipelle was inserted into the uterine cavity and sounded the uterus to a depth of 9cm.  A Large amount of tissue was collected after 3 passes; on the first pass, only the gelatinous material was obtained.  The sample was sent for pathologic examination.  Condition: Stable  Complications: None  Plan: The patient was advised to call for any fever or for prolonged or severe pain or bleeding. She was advised to use OTC analgesics as needed for mild to moderate pain. She was advised to avoid vaginal intercourse for 48 hours or until the bleeding has completely stopped.  Durene Romans MD Attending Center for [facility])

## 2017-08-13 NOTE — Progress Notes (Addendum)
Obstetrics and Gynecology New Patient Referral Evaluation  Appointment Date: 08/13/2017  OBGYN Clinic: Center for [facility]  Primary Care Provider: Elby Beck  Referring Provider: Elby Beck, FNP  Chief Complaint:  Chief Complaint  Patient presents with  . New Patient (Initial Visit)    vaginal itching/discharge    History of Present Illness: Claire Drake is a 77 y.o. Caucasian [id number] (LMP: early 62s), seen for the above chief complaint.   Five month history of vaginal discharge. Patient seen in late march for physical and had negative pap smear and was referred to [place] for evaluation.  She states she hasn't gotten worse but has persisted. Rarely sometimes has red streaks in it and has ?pain when she has streaks. She has discharge qday and has to use a liner a day. No prior history of this. No vaginal itching, dysuria, hematuria, blood in BMs, nausea, vomiting, abdominal pain.     Review of Systems:  as noted in the History of Present Illness.  Past Medical History:  No past medical history on file.  Past Surgical History:  Past Surgical History:  Procedure Laterality Date  . APPENDECTOMY    . CATARACT EXTRACTION, BILATERAL  05/12/2002  . CHOLECYSTECTOMY    . TUBAL LIGATION Bilateral     Past Obstetrical History:  OB History  Gravida Para Term Preterm AB Living  3 2 2   1 2   SAB TAB Ectopic Multiple Live Births  1       2    # Outcome Date GA Lbr Len/2nd Weight Sex Delivery Anes PTL Lv  3 SAB           2 Term           1 Term             Obstetric Comments  svd x 2. sab x 1    Past Gynecological History: As per HPI. History of Pap Smear(s): Yes.   Last pap 07/2017, which was NILM/HPV negative History of HRT use: no  Social History:  Social History   Socioeconomic History  . Marital status: Widowed    Spouse name: Not on file  . Number of children: Not on file  . Years of education: Not on file  . Highest education  level: Not on file  Occupational History  . Not on file  Social Needs  . Financial resource strain: Not on file  . Food insecurity:    Worry: Not on file    Inability: Not on file  . Transportation needs:    Medical: Not on file    Non-medical: Not on file  Tobacco Use  . Smoking status: Never Smoker  . Smokeless tobacco: Never Used  Substance and Sexual Activity  . Alcohol use: No    Frequency: Never  . Drug use: No  . Sexual activity: Never  Lifestyle  . Physical activity:    Days per week: Not on file    Minutes per session: Not on file  . Stress: Not on file  Relationships  . Social connections:    Talks on phone: Not on file    Gets together: Not on file    Attends religious service: Not on file    Active member of club or organization: Not on file    Attends meetings of clubs or organizations: Not on file    Relationship status: Not on file  . Intimate partner violence:    Fear of current  or ex partner: Not on file    Emotionally abused: Not on file    Physically abused: Not on file    Forced sexual activity: Not on file  Other Topics Concern  . Not on file  Social History Narrative  . Not on file    Family History:  Family History  Problem Relation Age of Onset  . Dementia Mother   . Heart disease Father   . Heart attack Father   . Diabetes Maternal Grandmother   . Heart attack Maternal Grandfather     Health Maintenance:  Colonoscopy: Yes.   Date: 2-3 years ago. Pt states it was normal  Medications [name] had no medications administered during this visit. Current Outpatient Medications  Medication Sig Dispense Refill  . aspirin 81 MG chewable tablet Chew by mouth daily.    . Cholecalciferol (VITAMIN D3) 50000 units TABS Take 1 tablet by mouth every 7 (seven) days. 12 tablet 1  . metFORMIN (GLUCOPHAGE) 500 MG tablet Take 1/2 tablet with dinner for 7 days, then take 1 tablet daily with dinner 90 tablet 3   No current facility-administered  medications for this visit.     Allergies Patient has no known allergies.   Physical Exam:  BP (!) 175/90   Pulse 72   Wt 168 lb (76.2 kg)   BMI 32.27 kg/m  Body mass index is 32.27 kg/m. General appearance: Well nourished, well developed female in no acute distress.  Neck:  Supple, normal appearance, and no thyromegaly  Cardiovascular: normal s1 and s2.  No murmurs, rubs or gallops. Respiratory:  Clear to auscultation bilateral. Normal respiratory effort Abdomen: positive bowel sounds and no masses, hernias; diffusely non tender to palpation, non distended. Neuro/Psych:  Normal mood and affect.  Skin:  Warm and dry.  Lymphatic:  No inguinal lymphadenopathy.   Pelvic exam: is not limited by body habitus External genitalia normal. Large genital hiatus (approx 5-6cm). Moderate amount of clear, gelatinous discharge all around the introitus. Large rectocele noted and approximately to -1 without valsalva. On speculum exam. Mild amount of same d/c, none actively coming out of os, no vaginal bleeding. Normal appearing cervix, nttp. Uterus:  enlarged: 8wk sized and non tender, mobile and Adnexa:  normal adnexa and no mass, fullness, tenderness Rectovaginal: negative  See procedure note for embx but moderate to large amounts of d/c material and bloody material and tissue removed.   Laboratory:  Wet prep: normal squamous epithelial cells noted  Radiology: none  Assessment: pt stable  Plan: pt stable 1. Vaginal discharge F/u final pathology. Will get u/s. D/w pt concerning picture and concern for malignancy. Will call with results - US PELVIC COMPLETE WITH TRANSVAGINAL; Future  2. HTN Patient states she has white coat HTN and they did come down some with her visit. inbasket message sent to her PCP. Pt asymptomatic and they have always been normal in the past.  RTC PRN  Durene Romans MD Attending Center for [facility])

## 2017-08-13 NOTE — Progress Notes (Signed)
Patient present to office today for recurring vaginal itching and discharge for several months.

## 2017-08-18 ENCOUNTER — Telehealth: Payer: Self-pay

## 2017-08-18 ENCOUNTER — Ambulatory Visit: Payer: Medicare Other

## 2017-08-18 ENCOUNTER — Telehealth: Payer: Self-pay | Admitting: Obstetrics and Gynecology

## 2017-08-18 DIAGNOSIS — Z8542 Personal history of malignant neoplasm of other parts of uterus: Secondary | ICD-10-CM | POA: Insufficient documentation

## 2017-08-18 DIAGNOSIS — C55 Malignant neoplasm of uterus, part unspecified: Secondary | ICD-10-CM | POA: Insufficient documentation

## 2017-08-18 NOTE — Telephone Encounter (Signed)
GYN Telephone Note D/w pt and she prefers [facility] CC referral. Will send for scheduling asap and cancel her u/s for today. Will send note to pcp   Durene Romans MD Attending Center for [facility] (Faculty Practice) 08/18/2017 Time: [phone]

## 2017-08-18 NOTE — Telephone Encounter (Signed)
GYN Telephone Note VM left at [phone] asking her to call me at the office. Will try again later.  Durene Romans MD Attending Center for [facility] (Faculty Practice) 08/18/2017 Time: [phone]

## 2017-08-18 NOTE — Telephone Encounter (Addendum)
Received call from Dr. Thomes Dinning office for urgent referral for new diagnosis of endometrial cancer. We can see Claire Drake 4/10 at 1330. Verified with her that her U/S has been cancelled. 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 Oncology Nurse Navigator Documentation  Navigator Location: [facility] (08/18/17 0900)   )Navigator Encounter Type: Telephone (08/18/17 0900) Telephone: Claire Drake Call;Incoming Call;Appt Confirmation/Clarification (08/18/17 0900)                                                  Time Spent with Patient: 15 (08/18/17 0900)

## 2017-08-19 ENCOUNTER — Other Ambulatory Visit: Payer: Self-pay | Admitting: *Deleted

## 2017-08-19 ENCOUNTER — Encounter (INDEPENDENT_AMBULATORY_CARE_PROVIDER_SITE_OTHER): Payer: Self-pay

## 2017-08-19 ENCOUNTER — Inpatient Hospital Stay: Payer: Medicare Other | Attending: Obstetrics and Gynecology | Admitting: Obstetrics and Gynecology

## 2017-08-19 ENCOUNTER — Telehealth: Payer: Self-pay | Admitting: Family Medicine

## 2017-08-19 VITALS — BP 194/121 | HR 89 | Temp 97.9°F | Resp 18 | Ht 60.5 in | Wt 169.0 lb

## 2017-08-19 DIAGNOSIS — I499 Cardiac arrhythmia, unspecified: Secondary | ICD-10-CM

## 2017-08-19 DIAGNOSIS — Z780 Asymptomatic menopausal state: Secondary | ICD-10-CM | POA: Insufficient documentation

## 2017-08-19 DIAGNOSIS — N816 Rectocele: Secondary | ICD-10-CM | POA: Insufficient documentation

## 2017-08-19 DIAGNOSIS — C541 Malignant neoplasm of endometrium: Secondary | ICD-10-CM | POA: Insufficient documentation

## 2017-08-19 NOTE — H&P (View-Only) (Signed)
Gynecologic Oncology Consult Visit   Referring Provider: Dr. Ilda Basset  Chief Complaint: Endometroid adenocarcinoma, grade 1  Subjective:  Claire Drake is a 77 y.o. [id number], post-menopausal, female who is seen in consultation from Clarene Reamer, FNP and Dr. Ilda Basset (Center [facility] at [facility]).   Initially, patient has 42-month history of vaginal discharge.  Last Pap 07/2017 was NILM, HPV negative.  No history of hormone replacement therapy.  On exam, moderate amount of clear, gelatinous discharge around introitus. Large rectocele noted and approximately to -1 w/o valsalva. On speculum, discharge again noted, none from os. No bleeding. Normal appearing cervix. Uterus: enlarged sized 8wk and nontender, mobile. Adnexa, normal w/o massess, fullness, or tenderness.   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  Today, patient reports overall feeling well. Denies bleeding or pain. She has been intentionally trying to lose weight. Continues to have moderate amounts of light colored vaginal discharge. Had mild intermittent pain and streaking blood after biopsy.   Problem List: Patient Active Problem List   Diagnosis Date Noted  . Uterine cancer ([place]) 08/18/2017  . Vaginal discharge 08/13/2017  . Transient hypertension 08/13/2017  . Uncontrolled type 2 diabetes mellitus with hyperglycemia ([place]) 08/06/2017  . 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 No pertinent past medical history.  Past Surgical History: Past Surgical History:  Procedure Laterality Date  . APPENDECTOMY    . CATARACT EXTRACTION, BILATERAL  05/12/2002  . CHOLECYSTECTOMY    . TUBAL LIGATION Bilateral     Past Gynecologic History:  G3P2. Denies abnormal pap smears. Tubal ligation in 1980s. Not currently sexually active. Denies history of STIs.  Denies HRT use.   OB History:  OB History  Gravida Para Term Preterm AB Living  3 2 2   1 2   SAB TAB Ectopic Multiple Live Births  1       2    # Outcome Date GA Lbr Len/2nd Weight Sex Delivery Anes PTL Lv  3 SAB           2 Term           1 Term             Obstetric Comments  svd x 2. sab x 1    Family History: Family History  Problem Relation Age of Onset  . Dementia Mother   . Heart disease Father   . Heart attack Father   . Diabetes Maternal Grandmother   . Heart attack Maternal Grandfather     Social History: Social History   Socioeconomic History  . Marital status: Widowed    Spouse name: Not on file  . Number of children: Not on file  . Years of education: Not on file  . Highest education level: Not on file  Occupational History  . Not on file  Social Needs  . Financial resource strain: Not on file  . Food insecurity:    Worry: Not on file    Inability: Not on file  . Transportation needs:    Medical: Not on file    Non-medical: Not on file  Tobacco Use  . Smoking status: Never Smoker  . Smokeless tobacco: Never Used  Substance and Sexual Activity  . Alcohol use: No    Frequency: Never  . Drug use: No  . Sexual activity: Never  Lifestyle  . Physical activity:  Days per week: Not on file    Minutes per session: Not on file  . Stress: Not on file  Relationships  . Social connections:    Talks on phone: Not on file    Gets together: Not on file    Attends religious service: Not on file    Active member of club or organization: Not on file    Attends meetings of clubs or organizations: Not on file    Relationship status: Not on file  . Intimate partner violence:    Fear of current or ex partner: Not on file    Emotionally abused: Not on file    Physically abused: Not on file    Forced sexual activity: Not on file  Other Topics Concern  . Not on file  Social History Narrative  . Not on file    Allergies: No Known  Allergies  Current Medications: Current Outpatient Medications  Medication Sig Dispense Refill  . aspirin 81 MG chewable tablet Chew by mouth daily.    . Cholecalciferol (VITAMIN D3) 50000 units TABS Take 1 tablet by mouth every 7 (seven) days. 12 tablet 1  . metFORMIN (GLUCOPHAGE) 500 MG tablet Take 1/2 tablet with dinner for 7 days, then take 1 tablet daily with dinner 90 tablet 3   No current facility-administered medications for this visit.     Review of Systems General: negative for, fevers, chills, fatigue, changes in sleep, changes in weight or appetite Skin: negative for changes in color, texture, moles or lesions Eyes: negative for, changes in vision, pain, diplopia HEENT: negative for, change in hearing, pain, discharge, tinnitus, vertigo, voice changes, sore throat, neck masses Breasts: negative for breast lumps Pulmonary: negative for, dyspnea, orthopnea, productive cough Cardiac: positive for irregular heart beat and palpitations, negative for, syncope, pain, discomfort, pressure Gastrointestinal: negative for, dysphagia, nausea, vomiting, jaundice, pain, constipation, diarrhea, hematemesis, hematochezia Genitourinary/Sexual: positive for vaginal discharge, negative for, dysuria, hesitancy, nocturia, retention, stones, infections, STD's, incontinence Ob/Gyn: negative for, irregular bleeding, pain Musculoskeletal: negative for, pain, stiffness, swelling, range of motion limitation Hematology: negative for, easy bruising, bleeding Neurologic/Psych: positive for anxiety, negative for, headaches, seizures, paralysis, weakness, tremor, change in gait, change in sensation, mood swings, depression, change in memory  Objective:  Physical Examination:  BP (!) 194/121 (BP Location: Left Arm, Patient Position: Sitting)   Pulse 89   Temp 97.9 F (36.6 C) (Tympanic)   Resp 18   Ht 5' 0.5" (1.537 m)   Wt 169 lb (76.7 kg)   BMI 32.46 kg/m     ECOG Performance Status: 1 -  Symptomatic but completely ambulatory   General appearance: alert, cooperative and appears stated age. Unaccompanied.  HEENT: ATNC Lymph Node Survey: Neck normal. Non-palpable axillary, inguinal, or supraclavicular lymph nodes.  Neck: normal Lungs: CTA bilaterally Heart: Irregularly irregular rhythm and pulse.  Abdomen: no palpable masses, no hernias, well healed incisions, soft, nontender, nondistended. Umbilicus normal.  Back: normal to inspection. No tenderness Extremities: no lower extremity edema Neurological exam reveals alert, oriented, normal speech, no focal findings or movement disorder noted  Pelvic: EGBUS/Vagina: large rectocoele, Cervix normal, Uterus small.  Bimanual/RV: no masses      Assessment:  CAITLINN KLINKER is a 77 y.o. female diagnosed with grade 1 endometrial cancer.  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    Medical co-morbidities complicating care: Atrial fibrillation.   She has irregular heart beat that has not been worked up by cardiology. History of T2DM with last ha1c 8.7.  Plan:   Problem List Items Addressed This Visit    None    Visit Diagnoses    Endometrial cancer ([place])    -  Primary   Irregular heart rhythm       Relevant Orders   Ambulatory referral to Cardiology     We discussed options for management including TLH/BSO, SLN mapping and biopsies.  Possible X lap, pelvic aortic node sampling.  Will have her seen by cardiology for management of atrial fibrillation prior to surgery first week of May.   The risks of surgery were discussed in detail and she understands these to include infection; wound separation; hernia; vaginal cuff separation, injury to adjacent organs such as bowel, bladder, blood vessels, ureters and nerves; bleeding which may require blood transfusion; anesthesia risk; thromboembolic events; possible death; unforeseen complications; possible need for re-exploration; medical complications such as heart attack, stroke, pleural  effusion and pneumonia; and, if staging performed the risk of lymphedema and lymphocyst.  The patient will receive DVT and antibiotic prophylaxis as indicated.  She voiced a clear understanding.  She had the opportunity to ask questions and written informed consent was obtained today.  The patient's diagnosis, an outline of the further diagnostic and laboratory studies which will be required, the recommendation for surgery, and alternatives were discussed with her and her accompanying family members.  All questions were answered to their satisfaction.  A total of 60 minutes were spent with the patient/family today; 40% was spent in education, counseling and coordination of care for endometrial cancer.    Verlon Au, NP  Medical screening examination/treatment/procedure(s) were performed by non-physician practitioner and as supervising physician I was immediately available for consultation/collaboration. Mellody Drown, MD  CC:  El[address], [address], [place] [address] [phone]

## 2017-08-19 NOTE — Telephone Encounter (Signed)
I attempted to call patient but didn't get an answer. Please call her and see how she is doing since getting endometrial cancer diagnosis. She has follow up scheduled for June, but can come in earlier if she needs to be seen sooner.

## 2017-08-19 NOTE — Patient Instructions (Signed)
Laparoscopy Laparoscopy is a procedure to diagnose diseases in the abdomen. During the procedure, a thin, lighted, pencil-sized instrument called a laparoscope is inserted into the abdomen through an incision. The laparoscope allows your health care provider to look at the organs inside your body. LET YOUR HEALTH CARE PROVIDER KNOW ABOUT:  Any allergies you have.  All medicines you are taking, including vitamins, herbs, eye drops, creams, and over-the-counter medicines.  Previous problems you or members of your family have had with the use of anesthetics.  Any blood disorders you have.  Previous surgeries you have had.  Medical conditions you have. RISKS AND COMPLICATIONS  Generally, this is a safe procedure. However, problems can occur, which may include:  Infection.  Bleeding.  Damage to other organs.  Allergic reaction to the anesthetics used during the procedure. BEFORE THE PROCEDURE  Do not eat or drink anything after midnight on the night before the procedure or as directed by your health care provider.  Ask your health care provider about: ? Changing or stopping your regular medicines. ? Taking medicines such as aspirin and ibuprofen. These medicines can thin your blood. Do not take these medicines before your procedure if your health care provider instructs you not to.  Plan to have someone take you home after the procedure. PROCEDURE  You may be given a medicine to help you relax (sedative).  You will be given a medicine to make you sleep (general anesthetic).  Your abdomen will be inflated with a gas. This will make your organs easier to see.  Small incisions will be made in your abdomen.  A laparoscope and other small instruments will be inserted into the abdomen through the incisions.  A tissue sample may be removed from an organ in the abdomen for examination.  The instruments will be removed from the abdomen.  The gas will be released.  The incisions  will be closed with stitches (sutures). AFTER THE PROCEDURE  Your blood pressure, heart rate, breathing rate, and blood oxygen level will be monitored often until the medicines you were given have worn off.   This information is not intended to replace advice given to you by your health care provider. Make sure you discuss any questions you have with your health care provider.     Clear Liquid Diet for GYN Oncology Patients Day Before Surgery The day before your scheduled surgery DO NOT EAT any solid foods.  We do want you to drink enough liquids, but NO MILK products.  We do not want you to be dehydrated.  Clear liquids are defined as no milk products and no pieces of any solid food. The following are all approved for you to drink the day before you surgery.  Chicken, Beef or Vegetable Broth (bouillon or consomm) - NO BROTH AFTER MIDNIGHT  Plain Jello  (no fruit)  Water  Strained lemonade or fruit punch  Gatorade (any flavor)  CLEAR Ensure or Boost Breeze  Fruit juices without pulp, such as apple, grape, or cranberry juice  Clear sodas - NO SODA AFTER MIDNIGHT  Ice Pops without bits of fruit or fruit pulp  Honey  Tea or coffee without milk or cream Any foods not on the above list should be avoided.                                                                                 DIVISION OF GYNECOLOGIC ONCOLOGY BOWEL PREP   The following instructions are extremely important to prepare for your surgery. Please follow them carefully   Step 1: Liquid Diet Instructions   The day before surgery, drink ONLY CLEAR LIQUIDS for breakfast, lunch, dinner and throughout the day.  Drink at least 64 oz of fluid.             CLEAR LIQUID EXAMPLES:             Beef, chicken or vegetable broth, sodas, coffee, tea (sugar, lemon             artificial sweeteners, honey are acceptable), juices (apple, grape, cranberry, any    mixture of clear juices). Kool-Aid, Gatorade, Jell-o (without  fruit), popsicles                          NO MILK, MILK PRODUCTS, NON-DAIRY CREAMERS    Step 2: Laxatives           The evening before surgery:   Time: around 5pm   Follow these instructions carefully.   Administer 1 Dulcolax suppository according to manufacturer instructions on the box. You will need to purchase this laxative at a pharmacy or grocery store.     Individual responses to laxatives vary; this prep may cause multiple bowel movements. It often works in 30 minutes and may take as long as 3 hours. Stay near an available bathroom.    It is important to stay hydrated. Ensure you are still drinking clear liquids.      IMPORTANT: FOR YOUR SAFETY, WE WILL HAVE TO CANCEL YOUR SURGERY IF YOU DO NOT FOLLOW THESE INSTRUCTIONS.    Do not eat anything after midnight (including gum or candy) prior to your surgery.  Avoid drinking carbonated beverages after midnight.  You can have clear liquids up until one hour before you arrive at the hospital. "Nothing by mouth" means no liquids, gum, candy, etc for one hour before your arrival time.    Bowel Symptoms After Surgery After gynecologic surgery, women often have temporary changes in bowel function (constipation and gas pain).  Following are tips to help prevent and treat common bowel problems.  It also tells you when to call the doctor.  This is important because some symptoms might be a sign of a more serious bowel problem such as obstruction (bowel blockage).  These problems are rare but can happen after gynecologic surgery.   Besides surgery, what can temporarily affect bowel function? 1. Dietary changes   2. Decreased physical activity   3.Antibiotics   4. Pain medication   How can I prevent constipation (three days or more without a stool)? 1. Include fiber in your diet: whole grains, raw or dried fruits & vegetables, prunes, prune/pear juiceDrink at least 8 glasses of liquid (preferably water) every day 2. Avoid: ? Gas forming  foods such as broccoli, beans, peas, salads, cabbage, sweet potatoes ? Greasy, fatty, or fried foods 3. Activity helps bowel function return to normal, walk around the house at least 3-4 times each day for 15 minutes or longer, if tolerated.  Rocking in a rocking chair is preferable to sitting still. 4. Stool softeners: these are not laxatives, but serve to soften the stool to avoid straining.  Take 2-4 times a day until normal bowel function returns         Examples: Colace or generic equivalent (Docusafe) 5. Bulk laxatives: provide a concentrated source of fiber.    They do not stimulate the bowel.  Take 1-2 times each day until normal bowel function return.              Examples: Citrucel, Metamucil, Fiberal, Fibercon   What can I take for "Gas Pains"? 1. Simethicone (Mylicon, Gas-X, Maalox-Gas, Mylanta-Gas) take 3-4 times a day 2. Maalox Regular - take 3-4 times a day 3. Mylanta Regular - take 3-4 times a day   What can I take if I become constipated? 1. Start with stool softeners and add additional laxatives below as needed to have a bowel movement every 1-2 days  2. Stool softeners 1-2 tablets, 2 times a day 3. Senakot 1-2 tablets, 1-2 times a day 4. Glycerin suppository can soften hard stool take once a day 5. Bisacodyl suppository once a day  6. Milk of Magnesia 30 mL 1-2 times a day 7. Fleets or tap water enema    What can I do for nausea?  1. Limit most solid foods for 24-48 hours 2. Continue eating small frequent amounts of liquids and/or bland soft foods ? Toast, crackers, cooked cereal (grits, cream of wheat, rice) 3. Benadryl: a mild anti-nausea medicine can be obtained without a prescription. May cause drowsiness, especially if taken with narcotic pain medicines 4. Contact provider for prescription nausea medication     What can I do, or take for diarrhea (more than five loose stools per day)? 1. Drink plenty of clear fluids to prevent dehydration 2. May take Kaopectate,  Pepto-Bismol, Immodium, or probiotics for 1-2 days 3. Annusol or Preparation-H can be helpful for hemorrhoids and irritated tissue around anus   When should I call the doctor?             CONSTIPATION:   Not relieved after three days following the above program VOMITING:  That contains blood, "coffee ground" material  More the three times/hour and unable to keep down nausea medication for more than eight hours  With dry mouth, dark or strong urine, feeling light-headed, dizzy, or confused  With severe abdominal pain or bloating for more than 24 hours DIARRHEA:  That continues for more then 24-48 hours despite treatment  That contains blood or tarry material  With dry mouth, dark or strong urine, feeling light~headed, dizzy, or confused FEVER:  101 F or higher along with nausea, vomiting, gas pain, diarrhea UNABLE TO:  Pass gas from rectum for more than 24 hours  Tolerate liquids by mouth for more than 24 hours     Laparoscopy, Care After Refer to this sheet in the next few weeks. These instructions provide you with information about caring for yourself after your procedure. Your health care provider may also give you more specific instructions. Your treatment has been planned according to current medical practices, but problems sometimes occur. Call your health care provider if you have any problems or questions after your procedure. WHAT TO EXPECT AFTER THE PROCEDURE After your procedure, it is common to have mild discomfort in the throat and abdomen. HOME CARE INSTRUCTIONS  Take over-the-counter and prescription medicines only as told by your health care provider.  Do not drive for 24 hours if you received a sedative.  Return to your normal activities as told by your health care provider.  Do not take baths, swim, or use a hot tub until your health care provider approves. You may shower.  Follow instructions from your health care provider about how to take care of  your incision. Make sure you: ? Wash   your hands with soap and water before you change your bandage (dressing). If soap and water are not available, use hand sanitizer. ? Change your dressing as told by your health care provider. ? Leave stitches (sutures), skin glue, or adhesive strips in place. These skin closures may need to stay in place for 2 weeks or longer. If adhesive strip edges start to loosen and curl up, you may trim the loose edges. Do not remove adhesive strips completely unless your health care provider tells you to do that.  Check your incision area every day for signs of infection. Check for: ? More redness, swelling, or pain. ? More fluid or blood. ? Warmth. ? Pus or a bad smell.  It is your responsibility to get the results of your procedure. Ask your health care provider or the department performing the procedure when your results will be ready. SEEK MEDICAL CARE IF:  There is new pain in your shoulders.  You feel light-headed or faint.  You are unable to pass gas or unable to have a bowel movement.  You feel nauseous or you vomit.  You develop a rash.  You have more redness, swelling, or pain around your incision.  You have more fluid or blood coming from your incision.  Your incision feels warm to the touch.  You have pus or a bad smell coming from your incision.  You have a fever or chills. SEEK IMMEDIATE MEDICAL CARE IF:  Your pain is getting worse.  You have ongoing vomiting.  The edges of your incision open up.  You have trouble breathing.  You have chest pain.   This information is not intended to replace advice given to you by your health care provider. Make sure you discuss any questions you have with your health care provider.   Laparoscopic Hysterectomy, Care After Refer to this sheet in the next few weeks. These instructions provide you with information on caring for yourself after your procedure. Your health care provider may also give  you more specific instructions. Your treatment has been planned according to current medical practices, but problems sometimes occur. Call your health care provider if you have any problems or questions after your procedure. What can I expect after the procedure?  Pain and bruising at the incision sites. You will be given pain medicine to control it.  Menopausal symptoms such as hot flashes, night sweats, and insomnia if your ovaries were removed.  Sore throat from the breathing tube that was inserted during surgery. Follow these instructions at home:  Only take over-the-counter or prescription medicines for pain, discomfort, or fever as directed by your health care provider.  Do not take aspirin. It can cause bleeding.  Do not drive when taking pain medicine.  Follow your health care provider's advice regarding diet, exercise, lifting, driving, and general activities.  Resume your usual diet as directed and allowed.  Get plenty of rest and sleep.  Do not douche, use tampons, or have sexual intercourse for at least 6 weeks, or until your health care provider gives you permission.  Change your bandages (dressings) as directed by your health care provider.  Monitor your temperature and notify your health care provider of a fever.  Take showers instead of baths for 2-3 weeks.  Do not drink alcohol until your health care provider gives you permission.  If you develop constipation, you may take a mild laxative with your health care provider's permission. Bran foods may help with constipation problems. Drinking enough fluids   to keep your urine clear or pale yellow may help as well.  Try to have someone home with you for 1-2 weeks to help around the house.  Keep all of your follow-up appointments as directed by your health care provider. Contact a health care provider if:  You have swelling, redness, or increasing pain around your incision sites.  You have pus coming from your  incision.  You notice a bad smell coming from your incision.  Your incision breaks open.  You feel dizzy or lightheaded.  You have pain or bleeding when you urinate.  You have persistent diarrhea.  You have persistent nausea and vomiting.  You have abnormal vaginal discharge.  You have a rash.  You have any type of abnormal reaction or develop an allergy to your medicine.  You have poor pain control with your prescribed medicine. Get help right away if:  You have chest pain or shortness of breath.  You have severe abdominal pain that is not relieved with pain medicine.  You have pain or swelling in your legs. This information is not intended to replace advice given to you by your health care provider. Make sure you discuss any questions you have with your health care provider. Document Released: 02/16/2013 Document Revised: 10/04/2015 Document Reviewed: 11/16/2012 Elsevier Interactive Patient Education  2017 [employer]     

## 2017-08-19 NOTE — Progress Notes (Signed)
Pre and post operative surgery completed. Copy of education provided in AVS. Surgery will be arranged for 09-09-17. We will call with PAT appt. Cardiology referral sent. Provided contact information for any future questions or concerns. Oncology Nurse Navigator Documentation  Navigator Location: [facility] (08/19/17 1500)   )Navigator Encounter Type: Initial GynOnc (08/19/17 1500) Telephone: Education (08/19/17 1500)                                                  Time Spent with Patient: 60 (08/19/17 1500)

## 2017-08-19 NOTE — Progress Notes (Signed)
Patient c/o discharge ( cream color with stricks of blood noted) x several months.No itchy or burning noted

## 2017-08-19 NOTE — Progress Notes (Signed)
Gynecologic Oncology Consult Visit   Referring Provider: Dr. Ilda Basset  Chief Complaint: Endometroid adenocarcinoma, grade 1  Subjective:  Claire Drake is a 77 y.o. [id number], post-menopausal, female who is seen in consultation from Clarene Reamer, FNP and Dr. Ilda Basset ([facility] at [facility]).   Initially, patient has 25-month history of vaginal discharge.  Last Pap 07/2017 was NILM, HPV negative.  No history of hormone replacement therapy.  On exam, moderate amount of clear, gelatinous discharge around introitus. Large rectocele noted and approximately to -1 w/o valsalva. On speculum, discharge again noted, none from os. No bleeding. Normal appearing cervix. Uterus: enlarged sized 8wk and nontender, mobile. Adnexa, normal w/o massess, fullness, or tenderness.   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  Today, patient reports overall feeling well. Denies bleeding or pain. She has been intentionally trying to lose weight. Continues to have moderate amounts of light colored vaginal discharge. Had mild intermittent pain and streaking blood after biopsy.   Problem List: Patient Active Problem List   Diagnosis Date Noted  . Uterine cancer ([place]) 08/18/2017  . Vaginal discharge 08/13/2017  . Transient hypertension 08/13/2017  . Uncontrolled type 2 diabetes mellitus with hyperglycemia ([place]) 08/06/2017  . 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 No pertinent past medical history.  Past Surgical History: Past Surgical History:  Procedure Laterality Date  . APPENDECTOMY    . CATARACT EXTRACTION, BILATERAL  05/12/2002  . CHOLECYSTECTOMY    . TUBAL LIGATION Bilateral     Past Gynecologic History:  G3P2. Denies abnormal pap smears. Tubal ligation in 1980s. Not currently sexually active. Denies history of STIs.  Denies HRT use.   OB History:  OB History  Gravida Para Term Preterm AB Living  3 2 2   1 2   SAB TAB Ectopic Multiple Live Births  1       2    # Outcome Date GA Lbr Len/2nd Weight Sex Delivery Anes PTL Lv  3 SAB           2 Term           1 Term             Obstetric Comments  svd x 2. sab x 1    Family History: Family History  Problem Relation Age of Onset  . Dementia Mother   . Heart disease Father   . Heart attack Father   . Diabetes Maternal Grandmother   . Heart attack Maternal Grandfather     Social History: Social History   Socioeconomic History  . Marital status: Widowed    Spouse name: Not on file  . Number of children: Not on file  . Years of education: Not on file  . Highest education level: Not on file  Occupational History  . Not on file  Social Needs  . Financial resource strain: Not on file  . Food insecurity:    Worry: Not on file    Inability: Not on file  . Transportation needs:    Medical: Not on file    Non-medical: Not on file  Tobacco Use  . Smoking status: Never Smoker  . Smokeless tobacco: Never Used  Substance and Sexual Activity  . Alcohol use: No    Frequency: Never  . Drug use: No  . Sexual activity: Never  Lifestyle  . Physical activity:  Days per week: Not on file    Minutes per session: Not on file  . Stress: Not on file  Relationships  . Social connections:    Talks on phone: Not on file    Gets together: Not on file    Attends religious service: Not on file    Active member of club or organization: Not on file    Attends meetings of clubs or organizations: Not on file    Relationship status: Not on file  . Intimate partner violence:    Fear of current or ex partner: Not on file    Emotionally abused: Not on file    Physically abused: Not on file    Forced sexual activity: Not on file  Other Topics Concern  . Not on file  Social History Narrative  . Not on file    Allergies: No Known  Allergies  Current Medications: Current Outpatient Medications  Medication Sig Dispense Refill  . aspirin 81 MG chewable tablet Chew by mouth daily.    . Cholecalciferol (VITAMIN D3) 50000 units TABS Take 1 tablet by mouth every 7 (seven) days. 12 tablet 1  . metFORMIN (GLUCOPHAGE) 500 MG tablet Take 1/2 tablet with dinner for 7 days, then take 1 tablet daily with dinner 90 tablet 3   No current facility-administered medications for this visit.     Review of Systems General: negative for, fevers, chills, fatigue, changes in sleep, changes in weight or appetite Skin: negative for changes in color, texture, moles or lesions Eyes: negative for, changes in vision, pain, diplopia HEENT: negative for, change in hearing, pain, discharge, tinnitus, vertigo, voice changes, sore throat, neck masses Breasts: negative for breast lumps Pulmonary: negative for, dyspnea, orthopnea, productive cough Cardiac: positive for irregular heart beat and palpitations, negative for, syncope, pain, discomfort, pressure Gastrointestinal: negative for, dysphagia, nausea, vomiting, jaundice, pain, constipation, diarrhea, hematemesis, hematochezia Genitourinary/Sexual: positive for vaginal discharge, negative for, dysuria, hesitancy, nocturia, retention, stones, infections, STD's, incontinence Ob/Gyn: negative for, irregular bleeding, pain Musculoskeletal: negative for, pain, stiffness, swelling, range of motion limitation Hematology: negative for, easy bruising, bleeding Neurologic/Psych: positive for anxiety, negative for, headaches, seizures, paralysis, weakness, tremor, change in gait, change in sensation, mood swings, depression, change in memory  Objective:  Physical Examination:  BP (!) 194/121 (BP Location: Left Arm, Patient Position: Sitting)   Pulse 89   Temp 97.9 F (36.6 C) (Tympanic)   Resp 18   Ht 5' 0.5" (1.537 m)   Wt 169 lb (76.7 kg)   BMI 32.46 kg/m     ECOG Performance Status: 1 -  Symptomatic but completely ambulatory   General appearance: alert, cooperative and appears stated age. Unaccompanied.  HEENT: ATNC Lymph Node Survey: Neck normal. Non-palpable axillary, inguinal, or supraclavicular lymph nodes.  Neck: normal Lungs: CTA bilaterally Heart: Irregularly irregular rhythm and pulse.  Abdomen: no palpable masses, no hernias, well healed incisions, soft, nontender, nondistended. Umbilicus normal.  Back: normal to inspection. No tenderness Extremities: no lower extremity edema Neurological exam reveals alert, oriented, normal speech, no focal findings or movement disorder noted  Pelvic: EGBUS/Vagina: large rectocoele, Cervix normal, Uterus small.  Bimanual/RV: no masses      Assessment:  Claire Drake is a 77 y.o. female diagnosed with grade 1 endometrial cancer.  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    Medical co-morbidities complicating care: Atrial fibrillation.   She has irregular heart beat that has not been worked up by cardiology. History of T2DM with last ha1c 8.7.  Plan:   Problem List Items Addressed This Visit    None    Visit Diagnoses    Endometrial cancer ([place])    -  Primary   Irregular heart rhythm       Relevant Orders   Ambulatory referral to Cardiology     We discussed options for management including TLH/BSO, SLN mapping and biopsies.  Possible X lap, pelvic aortic node sampling.  Will have her seen by cardiology for management of atrial fibrillation prior to surgery first week of May.   The risks of surgery were discussed in detail and she understands these to include infection; wound separation; hernia; vaginal cuff separation, injury to adjacent organs such as bowel, bladder, blood vessels, ureters and nerves; bleeding which may require blood transfusion; anesthesia risk; thromboembolic events; possible death; unforeseen complications; possible need for re-exploration; medical complications such as heart attack, stroke, pleural  effusion and pneumonia; and, if staging performed the risk of lymphedema and lymphocyst.  The patient will receive DVT and antibiotic prophylaxis as indicated.  She voiced a clear understanding.  She had the opportunity to ask questions and written informed consent was obtained today.  The patient's diagnosis, an outline of the further diagnostic and laboratory studies which will be required, the recommendation for surgery, and alternatives were discussed with her and her accompanying family members.  All questions were answered to their satisfaction.  A total of 60 minutes were spent with the patient/family today; 40% was spent in education, counseling and coordination of care for endometrial cancer.    Verlon Au, NP  Medical screening examination/treatment/procedure(s) were performed by non-physician practitioner and as supervising physician I was immediately available for consultation/collaboration. Mellody Drown, MD  CC:  [address], [place] [address] [phone]

## 2017-08-20 ENCOUNTER — Other Ambulatory Visit: Payer: Self-pay

## 2017-08-20 NOTE — Telephone Encounter (Signed)
Lm on pts vm requesting a call back with update of status.

## 2017-08-21 NOTE — Telephone Encounter (Signed)
Lm on pts vm requesting a call back 

## 2017-08-24 ENCOUNTER — Telehealth: Payer: Self-pay

## 2017-08-24 NOTE — Telephone Encounter (Signed)
Ms. Duggar notified of PAT appt on 4/23 at 0915 in the [facility] [address]. Read back performed. Oncology Nurse Navigator Documentation  Navigator Location: [facility] (08/24/17 1000)   )Navigator Encounter Type: Telephone (08/24/17 1000) Telephone: Lahoma Crocker Call;Appt Confirmation/Clarification (08/24/17 1000)                                                  Time Spent with Patient: 15 (08/24/17 1000)

## 2017-08-26 ENCOUNTER — Other Ambulatory Visit: Payer: Self-pay | Admitting: *Deleted

## 2017-08-28 DIAGNOSIS — I499 Cardiac arrhythmia, unspecified: Secondary | ICD-10-CM | POA: Insufficient documentation

## 2017-08-28 NOTE — Progress Notes (Deleted)
Cardiology Office Note  Date:  08/28/2017   ID:  Claire Drake, DOB [birth year], MRN [id number]  PCP:  Elby Beck, FNP   No chief complaint on file.   HPI:  [name] is a 77 year old woman with past medical history of Hypertension Poorly controlled type 2 diabetes, hemoglobin A1C 8.7, 5 months of vaginal discharge, Large rectocele Enlarged uterus Endometrium with adenocarcinoma  Referred by Mellody Drown, MD for consultation of palpitations, irregular heart rhythm, atrial fibrillation  Plan is for exploratory laparotomy pelvic aortic node sampling, treatment of  uterine cancer  EKG on February 20th 2019 shows normal sinus rhythm with APCs.  No evidence of atrial fibrillation  PMH:   has no past medical history on file.  PSH:    Past Surgical History:  Procedure Laterality Date  . APPENDECTOMY    . CATARACT EXTRACTION, BILATERAL  05/12/2002  . CHOLECYSTECTOMY    . TUBAL LIGATION Bilateral     Current Outpatient Medications  Medication Sig Dispense Refill  . aspirin EC 81 MG tablet Take 81 mg by mouth daily.    . calcium elemental as carbonate (TUMS ULTRA 1000) 400 MG chewable tablet Chew 1,000 mg by mouth daily as needed for heartburn.    . Cholecalciferol (VITAMIN D3) 50000 units TABS Take 1 tablet by mouth every 7 (seven) days. (Patient not taking: Reported on 08/26/2017) 12 tablet 1  . diphenhydrAMINE (BENADRYL) 25 mg capsule Take 25 mg by mouth at bedtime as needed for allergies or sleep.    . metFORMIN (GLUCOPHAGE) 500 MG tablet Take 1/2 tablet with dinner for 7 days, then take 1 tablet daily with dinner (Patient taking differently: Take 500 mg by mouth daily with supper. ) 90 tablet 3  . naproxen sodium (ALEVE) 220 MG tablet Take 220-440 mg by mouth daily as needed (pain).     No current facility-administered medications for this visit.      Allergies:   Patient has no known allergies.   Social History:  The patient  reports that she has never smoked.  She has never used smokeless tobacco. She reports that she does not drink alcohol or use drugs.   Family History:   family history includes Dementia in her mother; Diabetes in her maternal grandmother; Heart attack in her father and maternal grandfather; Heart disease in her father.    Review of Systems: ROS   PHYSICAL EXAM: VS:  There were no vitals taken for this visit. , BMI There is no height or weight on file to calculate BMI. GEN: Well nourished, well developed, in no acute distress  HEENT: normal  Neck: no JVD, carotid bruits, or masses Cardiac: RRR; no murmurs, rubs, or gallops,no edema  Respiratory:  clear to auscultation bilaterally, normal work of breathing GI: soft, nontender, nondistended, + BS MS: no deformity or atrophy  Skin: warm and dry, no rash Neuro:  Strength and sensation are intact Psych: euthymic mood, full affect    Recent Labs: 07/01/2017: ALT 15; BUN 11; Creatinine, Ser 0.69; Hemoglobin 15.4; Magnesium 1.8; Platelets 226.0; Potassium 4.2; Sodium 138; TSH 2.28    Lipid Panel Lab Results  Component Value Date   CHOL 184 07/29/2017   HDL 60.20 07/29/2017   LDLCALC 107 (H) 07/29/2017   TRIG 81.0 07/29/2017      Wt Readings from Last 3 Encounters:  08/19/17 169 lb (76.7 kg)  08/13/17 168 lb (76.2 kg)  08/05/17 168 lb 12 oz (76.5 kg)  ASSESSMENT AND PLAN:  No diagnosis found.   Disposition:   F/U  6 months  No orders of the defined types were placed in this encounter.    Signed, Esmond Plants, M.D., Ph.D. 08/28/2017  [place], [place]

## 2017-08-31 ENCOUNTER — Encounter: Payer: Self-pay | Admitting: Obstetrics and Gynecology

## 2017-08-31 ENCOUNTER — Ambulatory Visit: Payer: Medicare Other | Admitting: Cardiovascular Disease

## 2017-08-31 ENCOUNTER — Encounter: Payer: Self-pay | Admitting: *Deleted

## 2017-08-31 NOTE — Progress Notes (Signed)
  Oncology Nurse Navigator Documentation  Navigator Location: [facility] (08/31/17 1500)   )Navigator Encounter Type: Clinic/MDC (08/31/17 1500)                                                    Time Spent with Patient: 45 (08/31/17 1500)   Patient came by the clinic today concerned that her cardiology appointment is the day before her surgery with Dr. Limmie Patricia.  States she has a-fib.  Read Dr. Janna Arch notes and he had referred patient to cardiology for cardiac clearance prior to surgery for atrial fibrillation since it had never been worked up.  Called Dr. Donivan Scull office to see if we could try and schedule her to be seen this week to avoid any possibility of cancelling her surgery.  The had one cancellation for Monday, the 29th.  Talked to the nurse also at Dr. Waynetta Sandy and reviewed that Dr. Limmie Patricia only comes twice a month for surgery here, and is there any way we could move her appointment up.  The only date she could give me was the 29th.  I have left Dr. Al Corpus a message in regards to the patient's appointment date, and the only anti-coagulant she is currently on at this time is an 81 mg Aspirin.  Hopefully he will notify me if not stopping the Aspirin will keep her from having her surgery on May the first.  Notified patient of the change in her appointment to the 29th at 3:00.  Will try again tomorrow to check for any more cancellations with Dr. Rockey Situ.

## 2017-08-31 NOTE — Progress Notes (Signed)
Dr. Limmie Patricia returned my call.  He states that taking the 81 mg Aspirin should not affect any change in her surgery date.  He is ok with her appointment on the 29th.  Patient informed and relieved.  She is going to keep her appointment for the 29th.

## 2017-09-01 ENCOUNTER — Encounter (HOSPITAL_COMMUNITY): Payer: Self-pay

## 2017-09-01 ENCOUNTER — Other Ambulatory Visit: Payer: Self-pay

## 2017-09-01 ENCOUNTER — Encounter
Admission: RE | Admit: 2017-09-01 | Discharge: 2017-09-01 | Disposition: A | Discharge status: Home / Self Care | Payer: Medicare Other | Source: Ambulatory Visit | Attending: Obstetrics and Gynecology | Admitting: Obstetrics and Gynecology

## 2017-09-01 ENCOUNTER — Ambulatory Visit
Admission: RE | Admit: 2017-09-01 | Discharge: 2017-09-01 | Disposition: A | Discharge status: Home / Self Care | Payer: Medicare Other | Source: Ambulatory Visit | Attending: Obstetrics and Gynecology | Admitting: Obstetrics and Gynecology

## 2017-09-01 DIAGNOSIS — Z01811 Encounter for preprocedural respiratory examination: Secondary | ICD-10-CM | POA: Insufficient documentation

## 2017-09-01 DIAGNOSIS — Z01812 Encounter for preprocedural laboratory examination: Secondary | ICD-10-CM | POA: Diagnosis not present

## 2017-09-01 HISTORY — DX: Rectocele: N81.6

## 2017-09-01 HISTORY — DX: Unspecified osteoarthritis, unspecified site: M19.90

## 2017-09-01 HISTORY — DX: Essential (primary) hypertension: I10

## 2017-09-01 HISTORY — DX: Malignant (primary) neoplasm, unspecified: C80.1

## 2017-09-01 HISTORY — DX: Cardiac arrhythmia, unspecified: I49.9

## 2017-09-01 HISTORY — DX: Gastro-esophageal reflux disease without esophagitis: K21.9

## 2017-09-01 LAB — CBC WITH DIFFERENTIAL/PLATELET
BASOS ABS: 0.1 10*3/uL (ref 0–0.1)
BASOS PCT: 1 %
EOS ABS: 0.1 10*3/uL (ref 0–0.7)
Eosinophils Relative: 2 %
HEMATOCRIT: 47 % (ref 35.0–47.0)
HEMOGLOBIN: 15.8 g/dL (ref 12.0–16.0)
Lymphocytes Relative: 14 %
Lymphs Abs: 0.9 10*3/uL — ABNORMAL LOW (ref 1.0–3.6)
MCH: 32.1 pg (ref 26.0–34.0)
MCHC: 33.7 g/dL (ref 32.0–36.0)
MCV: 95.1 fL (ref 80.0–100.0)
MONO ABS: 0.4 10*3/uL (ref 0.2–0.9)
Monocytes Relative: 7 %
NEUTROS ABS: 4.7 10*3/uL (ref 1.4–6.5)
NEUTROS PCT: 76 %
Platelets: 208 10*3/uL (ref 150–440)
RBC: 4.94 MIL/uL (ref 3.80–5.20)
RDW: 13.9 % (ref 11.5–14.5)
WBC: 6.1 10*3/uL (ref 3.6–11.0)

## 2017-09-01 LAB — COMPREHENSIVE METABOLIC PANEL
ALBUMIN: 4.4 g/dL (ref 3.5–5.0)
ALT: 16 U/L (ref 14–54)
ANION GAP: 9 (ref 5–15)
AST: 20 U/L (ref 15–41)
Alkaline Phosphatase: 71 U/L (ref 38–126)
BILIRUBIN TOTAL: 0.6 mg/dL (ref 0.3–1.2)
BUN: 18 mg/dL (ref 6–20)
CO2: 26 mmol/L (ref 22–32)
Calcium: 9.4 mg/dL (ref 8.9–10.3)
Chloride: 102 mmol/L (ref 101–111)
Creatinine, Ser: 0.65 mg/dL (ref 0.44–1.00)
GFR calc Af Amer: >60 mL/min (ref >60)
GFR calc non Af Amer: >60 mL/min (ref >60)
GLUCOSE: 147 mg/dL — AB (ref 65–99)
POTASSIUM: 3.7 mmol/L (ref 3.5–5.1)
SODIUM: 137 mmol/L (ref 135–145)
Total Protein: 7.2 g/dL (ref 6.5–8.1)

## 2017-09-01 LAB — URINALYSIS, ROUTINE W REFLEX MICROSCOPIC
BACTERIA UA: NONE SEEN
BILIRUBIN URINE: NEGATIVE
Glucose, UA: NEGATIVE mg/dL
HGB URINE DIPSTICK: NEGATIVE
KETONES UR: 20 mg/dL — AB
LEUKOCYTES UA: NEGATIVE
NITRITE: NEGATIVE
PROTEIN: 30 mg/dL — AB
SQUAMOUS EPITHELIAL / LPF: NONE SEEN (ref 0–5)
Specific Gravity, Urine: 1.018 (ref 1.005–1.030)
pH: 5 (ref 5.0–8.0)

## 2017-09-01 LAB — PROTIME-INR
INR: 0.91
Prothrombin Time: 12.2 seconds (ref 11.4–15.2)

## 2017-09-01 LAB — TYPE AND SCREEN
ABO/RH(D): A POS
ANTIBODY SCREEN: NEGATIVE

## 2017-09-01 LAB — APTT: APTT: 31 s (ref 24–36)

## 2017-09-01 NOTE — Patient Instructions (Addendum)
Your procedure is scheduled on: Wednesday, MAY 1st  Report to [place].  To find out your arrival time please call [phone] between                    1PM - 3PM on Tuesday, April 30th  Remember: Instructions that are not followed completely may result in serious  medical risk, up to and including death, or upon the discretion of your surgeon  and anesthesiologist your surgery may need to be rescheduled.     _X__ 1. Do not eat food after midnight the night before your procedure.                 No gum chewing or hard candies.                  You may drink clear liquids up to 2 hours before you are scheduled to arrive for your surgery                - DO not drink clear liquids within 2 hours of the start of your surgery.                 Clear Liquids include:  water, apple juice without pulp, clear carbohydrate                 drink such as Clearfast of Gatorade, Black Coffee or Tea (Do not add                 anything to coffee or tea).  __X__2.  On the morning of surgery brush your teeth with toothpaste and water,                      you may rinse your mouth with mouthwash if you wish.                           Do not swallow any toothpaste of mouthwash.     _X__ 3.  No Alcohol for 24 hours before or after surgery.   _X__ 4.  Do Not Smoke or use e-cigarettes For 24 Hours Prior to Your Surgery.                 Do not use any chewable tobacco products for at least 6 hours prior to                 surgery.  ____  5.  Bring all medications with you on the day of surgery if instructed.   _x___  6.  Notify your doctor if there is any change in your medical condition      (cold, fever, infections).     Do not wear jewelry, make-up, hairpins, clips or nail polish. Do not wear lotions, powders, or perfumes. You may wear deodorant. Do not shave 48 hours prior to surgery. Men may shave face and neck. Do not bring valuables to the  hospital.    [facility] is not responsible for any belongings or valuables.  Contacts, dentures or bridgework may not be worn into surgery. Leave your suitcase in the car. After surgery it may be brought to your room. For patients admitted to the hospital, discharge time is determined by your treatment team.   Patients discharged the day of surgery will not be allowed to drive home.   Please read over the following fact  sheets that you were given:   PREPARING FOR SURGERY   ____ Take these medicines the morning of surgery with A SIP OF WATER:    1. NONE              2.   3.   4.  5.  6.  ____ Fleet Enema (as directed)   __X__ Use CHG Soap as directed  ____ Use inhalers on the day of surgery  __X__ Stop metformin 2 days prior to surgery               LAST DOSE ON April 28TH    _X___ Stop ALL ASPIRIN PRODUCTS NOW!!  _X___ Stop Anti-inflammatories NOW!!              THIS INCLUDES IBUPROFEN / MOTRIN / ADVIL / ALEVE / NAPROSYN   ____ Stop supplements until after surgery.    ____ Bring C-Pap to the hospital.   [place] (DRESS IS GOOD)  HAVE STOOL SOFTENERS FOR HOME USE.(COLACE / MIRALAX / SENAKOT)  BRING YOUR POA PAPERS SO WE CAN PUT A COPY IN YOUR RECORD  DRINK PLENTY OF LIQUIDS AFTER SURGERY.  HAVE A FIRM PILLOW AT HOME TO SUPPORT YOUR BELLY AFTER SURGERY.

## 2017-09-01 NOTE — Pre-Procedure Instructions (Signed)
ua faxed to dr Fransisca Connors

## 2017-09-01 NOTE — Progress Notes (Unsigned)
Social worker contacted patient to offer services and provided her name and telephone number. Patient is interested but would like to wait until after her surgery to access services.

## 2017-09-05 NOTE — Progress Notes (Signed)
Cardiology Office Note  Date:  09/07/2017   ID:  Claire Drake, Claire Drake [birth year], MRN [id number]  PCP:  Claire Beck, FNP   Chief Complaint  Patient presents with  . Other    Ref by Claire Reamer, NP for new A-fib & needs a cardiac clearance for a total hysterectomy tomorrow. Meds reviewed by the pt. verbally.      HPI:  [name] is a 78 year old woman with past medical history of Hypertension Poorly controlled type 2 diabetes, hemoglobin A1C 8.7, 5 months of vaginal discharge, Large rectocele Enlarged uterus Endometrium with adenocarcinoma Referred by Claire Drown, MD for consultation of palpitations, irregular heart rhythm  She reports that she is scheduled for total hysterectomy tomorrow  exploratory laparotomy pelvic aortic node sampling, treatment of  uterine cancer  Was told by visiting [place] insurance nurse that she was in atrial fibrillation given irregular heart rhythm No EKG was performed until February 2019  EKG on February 20th 2019 shows normal sinus rhythm with APCs.  No evidence of atrial fibrillation, personally reviewed by myself  She reports that she has Good exercise tolerance Denies any tachycardia Rarely feels any palpitation Active, no limitations Losing weight, intentionally Overall has no complaints except for recent stressors  Blood pressure typically well controlled 361 up to 443 systolic More recently with all of the stressors it has been running higher on her doctor visits  EKG personally reviewed by myself on todays visit Shows normal sinus rhythm with APCs Nonspecific ST abnormality mentioned on the EKG but not readily apparent perhaps an V6 but very subtle   PMH:   has a past medical history of Arthritis, Cancer ([place]) (08/2017), Dysrhythmia, GERD (gastroesophageal reflux disease), Hypertension (08/2017), Rectocele (2019), and Uncontrolled type 2 diabetes mellitus with hyperglycemia ([place]) (08/06/2017).  PSH:    Past Surgical  History:  Procedure Laterality Date  . APPENDECTOMY  1970  . CATARACT EXTRACTION, BILATERAL  05/12/2002  . CHOLECYSTECTOMY  1978  . TUBAL LIGATION Bilateral 1980    Current Outpatient Medications  Medication Sig Dispense Refill  . aspirin EC 81 MG tablet Take 81 mg by mouth daily.    . calcium elemental as carbonate (TUMS ULTRA 1000) 400 MG chewable tablet Chew 1,000 mg by mouth daily as needed for heartburn.    . diphenhydrAMINE (BENADRYL) 25 mg capsule Take 25 mg by mouth at bedtime as needed for allergies or sleep.    . metFORMIN (GLUCOPHAGE) 500 MG tablet Take 1/2 tablet with dinner for 7 days, then take 1 tablet daily with dinner (Patient taking differently: Take 500 mg by mouth daily with supper. ) 90 tablet 3  . naproxen sodium (ALEVE) 220 MG tablet Take 220-440 mg by mouth daily as needed (pain).     No current facility-administered medications for this visit.      Allergies:   Seasonal ic [cholestatin]   Social History:  The patient  reports that she has never smoked. She has never used smokeless tobacco. She reports that she does not drink alcohol or use drugs.   Family History:   family history includes Dementia in her mother; Diabetes in her maternal grandmother; Heart attack in her father and maternal grandfather; Heart disease in her father.    Review of Systems: Review of Systems  Constitutional: Negative.   Respiratory: Negative.   Cardiovascular: Negative.   Gastrointestinal: Negative.   Musculoskeletal: Negative.   Neurological: Negative.   Psychiatric/Behavioral: The patient is nervous/anxious.   All other systems reviewed  and are negative.    PHYSICAL EXAM: VS:  BP (!) 150/84 (BP Location: Right Arm, Patient Position: Sitting, Cuff Size: Normal)   Pulse 80   Ht 5' (1.524 m)   Wt 166 lb 4 oz (75.4 kg)   BMI 32.47 kg/m  , BMI Body mass index is 32.47 kg/m. GEN: Well nourished, well developed, in no acute distress  HEENT: normal  Neck: no JVD,  carotid bruits, or masses Cardiac: RRR; no murmurs, rubs, or gallops,no edema  Respiratory:  clear to auscultation bilaterally, normal work of breathing GI: soft, nontender, nondistended, + BS MS: no deformity or atrophy  Skin: warm and dry, no rash Neuro:  Strength and sensation are intact Psych: euthymic mood, full affect    Recent Labs: 07/01/2017: Magnesium 1.8; TSH 2.28 09/01/2017: ALT 16; BUN 18; Creatinine, Ser 0.65; Hemoglobin 15.8; Platelets 208; Potassium 3.7; Sodium 137    Lipid Panel Lab Results  Component Value Date   CHOL 184 07/29/2017   HDL 60.20 07/29/2017   LDLCALC 107 (H) 07/29/2017   TRIG 81.0 07/29/2017      Wt Readings from Last 3 Encounters:  09/07/17 166 lb 4 oz (75.4 kg)  09/01/17 165 lb (74.8 kg)  08/19/17 169 lb (76.7 kg)       ASSESSMENT AND PLAN:  Uncontrolled type 2 diabetes mellitus with hyperglycemia ([place]) Long discussion concerning diet, weight loss She reports weight loss 10 pounds over the past month Changed her diet, following low carbohydrate diet She feels higher numbers recently from worsening stress  Malignant neoplasm of uterus, unspecified site ([place]) Scheduled for hysterectomy tomorrow, node sampling etc. Acceptable risk, no further testing needed Good exercise tolerance, no atrial fibrillation noted only APCs  Irregular heart rhythm - Plan: EKG 12-Lead Likely misinterpreted by visiting nurse from [employer] Two most recent EKGs showing normal sinus rhythm  Transient hypertension Numbers should improve after stress of surgery tomorrow She will monitor blood pressure at home and call us if numbers run high Previous numbers prior to onset of recent stressors were 741 up to 423 systolic   Total encounter time more than 45 minutes  Greater than 50% was spent in counseling and coordination of care with the patient   Disposition:   F/U as needed   Orders Placed This Encounter  Procedures  . EKG 12-Lead      Signed, Claire Drake, M.D., Ph.D. 09/07/2017  [place], [place]

## 2017-09-07 ENCOUNTER — Encounter: Payer: Self-pay | Admitting: Cardiovascular Disease

## 2017-09-07 ENCOUNTER — Ambulatory Visit: Payer: Medicare Other | Admitting: Cardiovascular Disease

## 2017-09-07 VITALS — BP 150/84 | HR 80 | Ht 60.0 in | Wt 166.2 lb

## 2017-09-07 DIAGNOSIS — R030 Elevated blood-pressure reading, without diagnosis of hypertension: Secondary | ICD-10-CM | POA: Diagnosis not present

## 2017-09-07 DIAGNOSIS — I499 Cardiac arrhythmia, unspecified: Secondary | ICD-10-CM

## 2017-09-07 DIAGNOSIS — E1165 Type 2 diabetes mellitus with hyperglycemia: Secondary | ICD-10-CM

## 2017-09-07 DIAGNOSIS — C55 Malignant neoplasm of uterus, part unspecified: Secondary | ICD-10-CM

## 2017-09-07 NOTE — Patient Instructions (Addendum)
You are have premature atrial contractions Not atrial fibrillation  Please monitor blood pressure   Medication Instructions:   No medication changes made  Labwork:  No new labs needed  Testing/Procedures:  No further testing at this time   Follow-Up: It was a pleasure seeing you in the office today. Please call us if you have new issues that need to be addressed before your next appt.  [phone]  Your physician wants you to follow-up in:  As needed  If you need a refill on your cardiac medications before your next appointment, please call your pharmacy.  For educational health videos Log in to : [email or web address] Or : [email or web address], password : [email or web address]

## 2017-09-08 ENCOUNTER — Ambulatory Visit: Payer: Medicare Other | Admitting: Cardiovascular Disease

## 2017-09-08 ENCOUNTER — Telehealth: Payer: Self-pay

## 2017-09-08 MED ORDER — CEFAZOLIN SODIUM-DEXTROSE 2-4 GM/100ML-% IV SOLN
2.0000 g | INTRAVENOUS | Status: AC
  Administered 2017-09-09: 2 g via INTRAVENOUS

## 2017-09-08 NOTE — Telephone Encounter (Signed)
Pre-surgery call placed to [name]. She saw Dr. Rockey Situ (Cardiology), 4/30. She was cleared for surgery. She has no further questions regarding surgery at this time.  Malignant neoplasm of uterus, unspecified site [facility]) Scheduled for hysterectomy tomorrow, node sampling etc. Acceptable risk, no further testing needed Good exercise tolerance, no atrial fibrillation noted only APCs  Irregular heart rhythm - Plan: EKG 12-Lead Likely misinterpreted by visiting nurse from [place] insurance Two most recent EKGs showing normal sinus rhythm     Oncology Nurse Navigator Documentation  Navigator Location: [facility] (09/08/17 1200)   )Navigator Encounter Type: Telephone (09/08/17 1200) Telephone: Lahoma Crocker Call (09/08/17 1200)                                                  Time Spent with Patient: 15 (09/08/17 1200)

## 2017-09-09 ENCOUNTER — Ambulatory Visit: Payer: Medicare Other | Admitting: Certified Registered"

## 2017-09-09 ENCOUNTER — Ambulatory Visit
Admission: RE | Admit: 2017-09-09 | Discharge: 2017-09-09 | Disposition: A | Discharge status: Home / Self Care | Payer: Medicare Other | Source: Ambulatory Visit | Attending: Obstetrics and Gynecology | Admitting: Obstetrics and Gynecology

## 2017-09-09 ENCOUNTER — Encounter
Admission: RE | Disposition: A | Discharge status: Home / Self Care | Payer: Self-pay | Source: Ambulatory Visit | Attending: Obstetrics and Gynecology

## 2017-09-09 ENCOUNTER — Other Ambulatory Visit: Payer: Self-pay

## 2017-09-09 DIAGNOSIS — I10 Essential (primary) hypertension: Secondary | ICD-10-CM | POA: Insufficient documentation

## 2017-09-09 DIAGNOSIS — C541 Malignant neoplasm of endometrium: Secondary | ICD-10-CM | POA: Insufficient documentation

## 2017-09-09 DIAGNOSIS — Z79899 Other long term (current) drug therapy: Secondary | ICD-10-CM | POA: Diagnosis not present

## 2017-09-09 DIAGNOSIS — Z85828 Personal history of other malignant neoplasm of skin: Secondary | ICD-10-CM | POA: Diagnosis not present

## 2017-09-09 DIAGNOSIS — E119 Type 2 diabetes mellitus without complications: Secondary | ICD-10-CM | POA: Insufficient documentation

## 2017-09-09 DIAGNOSIS — Z7984 Long term (current) use of oral hypoglycemic drugs: Secondary | ICD-10-CM | POA: Insufficient documentation

## 2017-09-09 DIAGNOSIS — N736 Female pelvic peritoneal adhesions (postinfective): Secondary | ICD-10-CM | POA: Diagnosis not present

## 2017-09-09 DIAGNOSIS — Z7982 Long term (current) use of aspirin: Secondary | ICD-10-CM | POA: Insufficient documentation

## 2017-09-09 HISTORY — PX: LAPAROSCOPIC HYSTERECTOMY: SHX1926

## 2017-09-09 LAB — ABO/RH: ABO/RH(D): A POS

## 2017-09-09 LAB — GLUCOSE, CAPILLARY
Glucose-Capillary: 140 mg/dL — ABNORMAL HIGH (ref 65–99)
Glucose-Capillary: 199 mg/dL — ABNORMAL HIGH (ref 65–99)

## 2017-09-09 SURGERY — HYSTERECTOMY, TOTAL, LAPAROSCOPIC
Anesthesia: General

## 2017-09-09 MED ORDER — METHYLENE BLUE 1 % INJ SOLN
INTRAMUSCULAR | Status: AC
  Filled 2017-09-09: qty 10

## 2017-09-09 MED ORDER — INDOCYANINE GREEN 25 MG IV SOLR
INTRAVENOUS | Status: AC
  Filled 2017-09-09: qty 25

## 2017-09-09 MED ORDER — LIDOCAINE HCL (PF) 2 % IJ SOLN
INTRAMUSCULAR | Status: AC
  Filled 2017-09-09: qty 10

## 2017-09-09 MED ORDER — FENTANYL CITRATE (PF) 100 MCG/2ML IJ SOLN: 25.0000 ug | INTRAMUSCULAR | Status: DC | PRN

## 2017-09-09 MED ORDER — OXYCODONE HCL 5 MG PO TABS: 5.0000 mg | ORAL_TABLET | Freq: Three times a day (TID) | ORAL | 0 refills | Status: DC | PRN

## 2017-09-09 MED ORDER — KETOROLAC TROMETHAMINE 30 MG/ML IJ SOLN
INTRAMUSCULAR | Status: AC
  Administered 2017-09-09: 30 mg via INTRAVENOUS
  Filled 2017-09-09: qty 1

## 2017-09-09 MED ORDER — ROCURONIUM BROMIDE 100 MG/10ML IV SOLN
INTRAVENOUS | Status: DC | PRN
  Administered 2017-09-09: 50 mg via INTRAVENOUS
  Administered 2017-09-09: 10 mg via INTRAVENOUS

## 2017-09-09 MED ORDER — SUGAMMADEX SODIUM 200 MG/2ML IV SOLN
INTRAVENOUS | Status: DC | PRN
Start: 2017-09-09 — End: 2017-09-09
  Administered 2017-09-09: 200 mg via INTRAVENOUS

## 2017-09-09 MED ORDER — PHENYLEPHRINE HCL 10 MG/ML IJ SOLN
INTRAMUSCULAR | Status: DC | PRN
  Administered 2017-09-09: 100 ug via INTRAVENOUS

## 2017-09-09 MED ORDER — MIDAZOLAM HCL 2 MG/2ML IJ SOLN
INTRAMUSCULAR | Status: AC
  Filled 2017-09-09: qty 2

## 2017-09-09 MED ORDER — SUGAMMADEX SODIUM 200 MG/2ML IV SOLN
INTRAVENOUS | Status: AC
  Filled 2017-09-09: qty 2

## 2017-09-09 MED ORDER — PROPOFOL 10 MG/ML IV BOLUS
INTRAVENOUS | Status: AC
  Filled 2017-09-09: qty 20

## 2017-09-09 MED ORDER — HEPARIN SODIUM (PORCINE) 5000 UNIT/ML IJ SOLN
INTRAMUSCULAR | Status: AC
  Administered 2017-09-09: 5000 [IU] via SUBCUTANEOUS
  Filled 2017-09-09: qty 1

## 2017-09-09 MED ORDER — LIDOCAINE HCL (CARDIAC) PF 100 MG/5ML IV SOSY
PREFILLED_SYRINGE | INTRAVENOUS | Status: DC | PRN
  Administered 2017-09-09: 40 mg via INTRAVENOUS

## 2017-09-09 MED ORDER — FAMOTIDINE 20 MG PO TABS
ORAL_TABLET | ORAL | Status: AC
  Administered 2017-09-09: 20 mg via ORAL
  Filled 2017-09-09: qty 1

## 2017-09-09 MED ORDER — ONDANSETRON HCL 4 MG/2ML IJ SOLN
INTRAMUSCULAR | Status: AC
  Administered 2017-09-09: 4 mg via INTRAVENOUS
  Filled 2017-09-09: qty 2

## 2017-09-09 MED ORDER — CHLORHEXIDINE GLUCONATE CLOTH 2 % EX PADS: 6.0000 | MEDICATED_PAD | Freq: Once | CUTANEOUS | Status: DC

## 2017-09-09 MED ORDER — HYDROMORPHONE HCL 1 MG/ML IJ SOLN
INTRAMUSCULAR | Status: DC | PRN
  Administered 2017-09-09: 2 mg via INTRAVENOUS

## 2017-09-09 MED ORDER — ROCURONIUM BROMIDE 50 MG/5ML IV SOLN
INTRAVENOUS | Status: AC
  Filled 2017-09-09: qty 1

## 2017-09-09 MED ORDER — FAMOTIDINE 20 MG PO TABS
20.0000 mg | ORAL_TABLET | Freq: Once | ORAL | Status: AC
  Administered 2017-09-09: 20 mg via ORAL

## 2017-09-09 MED ORDER — ONDANSETRON HCL 4 MG/2ML IJ SOLN
INTRAMUSCULAR | Status: AC
  Filled 2017-09-09: qty 4

## 2017-09-09 MED ORDER — DEXAMETHASONE SODIUM PHOSPHATE 10 MG/ML IJ SOLN
INTRAMUSCULAR | Status: DC | PRN
  Administered 2017-09-09: 5 mg via INTRAVENOUS

## 2017-09-09 MED ORDER — DEXAMETHASONE SODIUM PHOSPHATE 10 MG/ML IJ SOLN
INTRAMUSCULAR | Status: AC
  Filled 2017-09-09: qty 1

## 2017-09-09 MED ORDER — GLYCOPYRROLATE 0.2 MG/ML IJ SOLN
INTRAMUSCULAR | Status: AC
  Filled 2017-09-09: qty 1

## 2017-09-09 MED ORDER — KETOROLAC TROMETHAMINE 30 MG/ML IJ SOLN
30.0000 mg | Freq: Four times a day (QID) | INTRAMUSCULAR | Status: DC
  Administered 2017-09-09: 30 mg via INTRAVENOUS

## 2017-09-09 MED ORDER — CEFAZOLIN SODIUM-DEXTROSE 2-4 GM/100ML-% IV SOLN
INTRAVENOUS | Status: AC
  Filled 2017-09-09: qty 100

## 2017-09-09 MED ORDER — PROPOFOL 10 MG/ML IV BOLUS
INTRAVENOUS | Status: DC | PRN
  Administered 2017-09-09: 50 mg via INTRAVENOUS
  Administered 2017-09-09: 120 mg via INTRAVENOUS
  Administered 2017-09-09: 30 mg via INTRAVENOUS

## 2017-09-09 MED ORDER — ONDANSETRON HCL 4 MG/2ML IJ SOLN
INTRAMUSCULAR | Status: DC | PRN
  Administered 2017-09-09: 4 mg via INTRAVENOUS

## 2017-09-09 MED ORDER — IBUPROFEN 600 MG PO TABS: 600.0000 mg | ORAL_TABLET | Freq: Four times a day (QID) | ORAL | 1 refills | Status: DC

## 2017-09-09 MED ORDER — HYDROMORPHONE HCL 1 MG/ML IJ SOLN
INTRAMUSCULAR | Status: AC
  Filled 2017-09-09: qty 2

## 2017-09-09 MED ORDER — INDOCYANINE GREEN 25 MG IV SOLR
INTRAVENOUS | Status: DC | PRN
  Administered 2017-09-09: 25 mg

## 2017-09-09 MED ORDER — HEPARIN SODIUM (PORCINE) 5000 UNIT/ML IJ SOLN
5000.0000 [IU] | Freq: Once | INTRAMUSCULAR | Status: AC
  Administered 2017-09-09: 5000 [IU] via SUBCUTANEOUS

## 2017-09-09 MED ORDER — ONDANSETRON HCL 4 MG/2ML IJ SOLN
4.0000 mg | Freq: Once | INTRAMUSCULAR | Status: AC | PRN
  Administered 2017-09-09: 4 mg via INTRAVENOUS

## 2017-09-09 MED ORDER — ROCURONIUM BROMIDE 50 MG/5ML IV SOLN
INTRAVENOUS | Status: AC
  Filled 2017-09-09: qty 2

## 2017-09-09 MED ORDER — MIDAZOLAM HCL 2 MG/2ML IJ SOLN
INTRAMUSCULAR | Status: DC | PRN
  Administered 2017-09-09 (×2): 1 mg via INTRAVENOUS

## 2017-09-09 MED ORDER — ACETAMINOPHEN 10 MG/ML IV SOLN
INTRAVENOUS | Status: DC | PRN
  Administered 2017-09-09: 1000 mg via INTRAVENOUS

## 2017-09-09 MED ORDER — BUPIVACAINE HCL (PF) 0.5 % IJ SOLN
INTRAMUSCULAR | Status: DC | PRN
  Administered 2017-09-09: 16 mL

## 2017-09-09 MED ORDER — ACETAMINOPHEN NICU IV SYRINGE 10 MG/ML
INTRAVENOUS | Status: AC
  Filled 2017-09-09: qty 1

## 2017-09-09 MED ORDER — GLYCOPYRROLATE 0.2 MG/ML IJ SOLN
INTRAMUSCULAR | Status: DC | PRN
  Administered 2017-09-09: 0.1 mg via INTRAVENOUS

## 2017-09-09 MED ORDER — SODIUM CHLORIDE 0.9 % IV SOLN
INTRAVENOUS | Status: DC
  Administered 2017-09-09 (×2): via INTRAVENOUS

## 2017-09-09 MED ORDER — BUPIVACAINE HCL (PF) 0.5 % IJ SOLN
INTRAMUSCULAR | Status: AC
  Filled 2017-09-09: qty 30

## 2017-09-09 SURGICAL SUPPLY — 62 items
APPLICATOR SURGIFLO ENDO (HEMOSTASIS) IMPLANT
BAG URINE DRAINAGE (UROLOGICAL SUPPLIES) ×3 IMPLANT
BLADE SURG 15 STRL LF DISP TIS (BLADE) ×1 IMPLANT
BLADE SURG 15 STRL SS (BLADE) ×2
CANISTER SUCT 1200ML W/VALVE (MISCELLANEOUS) ×3 IMPLANT
CANNULA DILATOR  5MM W/SLV (CANNULA) ×1
CANNULA DILATOR 5 W/SLV (CANNULA) ×2 IMPLANT
CATH FOLEY 2WAY  5CC 16FR (CATHETERS) ×2
CATH URTH 16FR FL 2W BLN LF (CATHETERS) ×1 IMPLANT
CHLORAPREP W/TINT 26ML (MISCELLANEOUS) ×3 IMPLANT
CNTNR SPEC 2.5X3XGRAD LEK (MISCELLANEOUS) ×2
CONT SPEC 4OZ STER OR WHT (MISCELLANEOUS) ×4
CONTAINER SPEC 2.5X3XGRAD LEK (MISCELLANEOUS) ×2 IMPLANT
CORD MONOPOLAR M/FML 12FT (MISCELLANEOUS) ×3 IMPLANT
DEFOGGER SCOPE WARMER CLEARIFY (MISCELLANEOUS) ×3 IMPLANT
DERMABOND ADVANCED (GAUZE/BANDAGES/DRESSINGS) ×2
DERMABOND ADVANCED .7 DNX12 (GAUZE/BANDAGES/DRESSINGS) ×1 IMPLANT
DEVICE PMI PUNCTURE CLOSURE (MISCELLANEOUS) ×3 IMPLANT
DEVICE SUTURE ENDOST 10MM (ENDOMECHANICALS) ×3 IMPLANT
DRAPE STERI POUCH LG 24X46 STR (DRAPES) ×6 IMPLANT
GLOVE BIO SURGEON STRL SZ8 (GLOVE) ×12 IMPLANT
GLOVE INDICATOR 8.0 STRL GRN (GLOVE) ×3 IMPLANT
GOWN STRL REUS W/ TWL LRG LVL3 (GOWN DISPOSABLE) ×2 IMPLANT
GOWN STRL REUS W/ TWL XL LVL3 (GOWN DISPOSABLE) ×1 IMPLANT
GOWN STRL REUS W/TWL LRG LVL3 (GOWN DISPOSABLE) ×4
GOWN STRL REUS W/TWL XL LVL3 (GOWN DISPOSABLE) ×2
IRRIGATION STRYKERFLOW (MISCELLANEOUS) IMPLANT
IRRIGATOR STRYKERFLOW (MISCELLANEOUS)
IV LACTATED RINGERS 1000ML (IV SOLUTION) ×3 IMPLANT
KIT PINK PAD W/HEAD ARE REST (MISCELLANEOUS) ×3
KIT PINK PAD W/HEAD ARM REST (MISCELLANEOUS) ×1 IMPLANT
LABEL OR SOLS (LABEL) ×3 IMPLANT
LIGASURE VESSEL 5MM BLUNT TIP (ELECTROSURGICAL) ×3 IMPLANT
MANIPULATOR VCARE LG CRV RETR (MISCELLANEOUS) IMPLANT
MANIPULATOR VCARE SML CRV RETR (MISCELLANEOUS) IMPLANT
MANIPULATOR VCARE STD CRV RETR (MISCELLANEOUS) ×3 IMPLANT
NDL INSUFF ACCESS 14 VERSASTEP (NEEDLE) ×3 IMPLANT
NEEDLE SPNL 22GX5 LNG QUINC BK (NEEDLE) ×3 IMPLANT
NS IRRIG 500ML POUR BTL (IV SOLUTION) ×3 IMPLANT
OCCLUDER COLPOPNEUMO (BALLOONS) ×3 IMPLANT
PACK GYN LAPAROSCOPIC (MISCELLANEOUS) ×3 IMPLANT
PAD OB MATERNITY 4.3X12.25 (PERSONAL CARE ITEMS) ×3 IMPLANT
PAD PREP 24X41 OB/GYN DISP (PERSONAL CARE ITEMS) ×3 IMPLANT
POUCH ENDO CATCH II 15MM (MISCELLANEOUS) IMPLANT
SET CYSTO W/LG BORE CLAMP LF (SET/KITS/TRAYS/PACK) ×3 IMPLANT
SHEARS ENDO 5MM 31CM (CUTTER) ×3 IMPLANT
SPOGE SURGIFLO 8M (HEMOSTASIS)
SPONGE LAP 4X18 5PK (MISCELLANEOUS) IMPLANT
SPONGE SURGIFLO 8M (HEMOSTASIS) IMPLANT
SUT ENDO VLOC 180-0-8IN (SUTURE) ×3 IMPLANT
SUT MNCRL 4-0 (SUTURE) ×4
SUT MNCRL 4-0 27XMFL (SUTURE) ×2
SUT VIC AB 0 CT1 36 (SUTURE) ×6 IMPLANT
SUT VIC AB 2-0 UR6 27 (SUTURE) ×3 IMPLANT
SUTURE MNCRL 4-0 27XMF (SUTURE) ×2 IMPLANT
SYR 10ML LL (SYRINGE) ×3 IMPLANT
SYR 3ML LL SCALE MARK (SYRINGE) ×6 IMPLANT
SYR 50ML LL SCALE MARK (SYRINGE) ×6 IMPLANT
TROCAR BLUNT TIP 12MM OMST12BT (TROCAR) ×3 IMPLANT
TROCAR VERSASTEP PLUS 12MM (TROCAR) ×3 IMPLANT
TROCAR VERSASTEP PLUS 5MM (TROCAR) ×6 IMPLANT
TUBING INSUF HEATED (TUBING) ×3 IMPLANT

## 2017-09-09 NOTE — Progress Notes (Signed)
Dr. Kayleen Memos and Dr. Fransisca Connors aware of skin tear on LFA, ok to proceed to surgery.

## 2017-09-09 NOTE — Anesthesia Procedure Notes (Signed)
Procedure Name: Intubation Date/Time: 09/09/2017 7:46 AM Performed by: Nile Riggs, CRNA Pre-anesthesia Checklist: Patient identified, Emergency Drugs available, Suction available, Patient being monitored and Timeout performed Patient Re-evaluated:Patient Re-evaluated prior to induction Oxygen Delivery Method: Circle system utilized Preoxygenation: Pre-oxygenation with 100% oxygen Induction Type: IV induction Ventilation: Mask ventilation without difficulty Laryngoscope Size: Miller and 2 Grade View: Grade I Tube type: Oral Tube size: 7.5 mm Number of attempts: 1 Airway Equipment and Method: Stylet Placement Confirmation: ETT inserted through vocal cords under direct vision,  positive ETCO2,  CO2 detector and breath sounds checked- equal and bilateral Secured at: 19 cm Tube secured with: Tape Dental Injury: Teeth and Oropharynx as per pre-operative assessment

## 2017-09-09 NOTE — Anesthesia Postprocedure Evaluation (Signed)
Anesthesia Post Note  Patient: Claire Drake  Procedure(s) Performed: HYSTERECTOMY TOTAL LAPAROSCOPIC WITH REMOVAL OF OVARIES AND TUBES, SENTINEL LYMPH NODE MAPPING AND BIOPSIES (N/A )  Patient location during evaluation: PACU Anesthesia Type: General Level of consciousness: awake and alert and oriented Pain management: pain level controlled Vital Signs Assessment: post-procedure vital signs reviewed and stable Respiratory status: spontaneous breathing Cardiovascular status: blood pressure returned to baseline Anesthetic complications: no     Last Vitals:  Vitals:   09/09/17 1121 09/09/17 1247  BP: (!) 147/69 (!) 175/98  Pulse: 84 93  Resp: 18 16  Temp: 36.6 C   SpO2: 95% 96%    Last Pain:  Vitals:   09/09/17 1247  TempSrc:   PainSc: 0-No pain                 Blen Ransome

## 2017-09-09 NOTE — Interval H&P Note (Signed)
History and Physical Interval Note:  09/09/2017 7:02 AM  Claire Drake  has presented today for surgery, with the diagnosis of ENDOMETRIAL CANCER  The various methods of treatment have been discussed with the patient and family. After consideration of risks, benefits and other options for treatment, the patient has consented to  Procedure(s): HYSTERECTOMY TOTAL LAPAROSCOPIC WITH REMOVAL OF OVARIES AND TUBES, SENTINEL LYMPH NODE MAPPING AND BIOPSIES (N/A) LYMPH NODE BIOPSY AND MAPPING (N/A) as a surgical intervention .  The patient's history has been reviewed, patient examined, no change in status, stable for surgery.  I have reviewed the patient's chart and labs.  Questions were answered to the patient's satisfaction.   Mellody Drown, MD    Mellody Drown

## 2017-09-09 NOTE — Anesthesia Post-op Follow-up Note (Signed)
Anesthesia QCDR form completed.        

## 2017-09-09 NOTE — Transfer of Care (Signed)
Immediate Anesthesia Transfer of Care Note  Patient: Claire Drake  Procedure(s) Performed: HYSTERECTOMY TOTAL LAPAROSCOPIC WITH REMOVAL OF OVARIES AND TUBES, SENTINEL LYMPH NODE MAPPING AND BIOPSIES (N/A ) LYMPH NODE BIOPSY AND MAPPING (N/A )  Patient Location: PACU  Anesthesia Type:General  Level of Consciousness: drowsy and patient cooperative  Airway & Oxygen Therapy: Patient Spontanous Breathing and Patient connected to face mask oxygen  Post-op Assessment: Report given to RN, Post -op Vital signs reviewed and stable and Patient moving all extremities  Post vital signs: Reviewed and stable  Last Vitals:  Vitals Value Taken Time  BP 155/80 09/09/2017 10:19 AM  Temp 37.6 C 09/09/2017 10:19 AM  Pulse 86 09/09/2017 10:23 AM  Resp 12 09/09/2017 10:23 AM  SpO2 97 % 09/09/2017 10:23 AM  Vitals shown include unvalidated device data.  Last Pain:  Vitals:   09/09/17 0629  TempSrc: Tympanic  PainSc: 0-No pain         Complications: No apparent anesthesia complications

## 2017-09-09 NOTE — Op Note (Signed)
Operative Note   09/09/2017 10:57 AM  PRE-OP DIAGNOSIS: GRADE 1 ENDOMETRIAL CANCER    POST-OP DIAGNOSIS: Same  SURGEON: Surgeon(s) and Role:    Mellody Drown, MD - Primary    * Ward, Honor Loh, MD - Assisting  ANESTHESIA: General   PROCEDURE: HYSTERECTOMY TOTAL LAPAROSCOPIC WITH REMOVAL OF OVARIES AND TUBES, BILATERAL PELVIC SENTINEL LYMPH NODE MAPPING AND BIOPSIES   ESTIMATED BLOOD LOSS: Minimal  DRAINS: none  TOTAL IV FLUIDS: per anesthesia  SPECIMENS: uterus, fallopian tubes, pelvic lymph nodes.  COMPLICATIONS: none  DISPOSITION: PACU - hemodynamically stable.  CONDITION: stable  INDICATIONS: Grade 1 endometrial cancer  FINDINGS: normal uterus, tubes and ovaries.  Some filmy adhesions in the pelvis and of omentum to the umbilicus. No evidence of metastatic disease.    PROCEDURE IN DETAIL: After informed consent was obtained, the patient was taken to the operating room where anesthesia was obtained without difficulty. The patient was positioned in the dorsal lithotomy position in [place] and her arms were carefully tucked at her sides and the usual precautions were taken.  She was prepped and draped in normal sterile fashion.  Time-out was performed and a Foley catheter was placed into the bladder and the cervix was infiltrated with 4 ml of ICG fluorescent dye at 3 an 9 o'clock both superficial and deep injections. A standard VCare uterine manipulator was then placed in the uterus without incident.    An open Hasson technique was used to place an infraumbilical 42-VZ baloon trocar under direct visualization. The laparoscope was introduced and CO2 gas was infused for pneumoperitoneum to a pressure of 15 mm Hg.  Right and left lateral 5-mm ports and a 5-12 mm suprapubic port were placed under direct visualization of the laparoscope using an EndoStep technique. Omental and pelvic adhesions were taken down with the LigaSure.    Cytologic washings were obtained.  The  patient was placed in Trendelenburg and the bowel was displaced up into the upper abdomen.  Round ligaments were divided on each side with the EndoShears and the retroperitoneal space was opened bilaterally.  The ureters were identified and preserved.  At this point the retroperitoneal spaces were developed and the lymphatic channels were mapped to each side.  The sentinel node on the right side was then identified, skeletonized and removed taking care not to injure the  obturator nerve, the ureter or the pelvic vasculature.  There were two green external iliac nodes removed on the right.  Similarly on the left side, the retroperitoneal spaces were developed, the lymphatic channels mapped to identify the sentinel node(s) and they were similarly removed with care to preserve the obturator nerve, the ureter and the pelvic vasculature. The one green node candidate in the left external iliac area was removed, but not sure it was really a node.  So removed a few additional left external iliac nodes nearby.  With hemostasis secured, the infundibulopelvic ligaments were skeletonized, sealed and divided with the LigaSure device.  A bladder flap was created and the bladder was dissected down off the lower uterine segment and cervix using endoshears and electrocautery.  The uterine arteries were skeletonized bilaterally, sealed and divided with the LigaSure device.  A colpotomy was performed circumferentially along the V-Care ring with electrocautery and the cervix was incised from the vagina and the specimen was removed through the vagina.  A pneumo balloon was placed in the vagina and the vaginal cuff was then closed in a running continuous fashion using the EndoStitch technique  with 0 V-Lock suture with careful attention to include the vaginal cuff angles and the vaginal mucosa within the closure.  Intraoperative pathologic evaluation revealed favorable risk criteria and therefore further dissection was not  performed.  Hemostasis was observed. The intraperitoneal pressure was dropped, and all planes of dissection, vascular pedicles and the vaginal cuff were found to be hemostatic.  The suprapubic trocar was removed and the fascia was closed with 0 Vicryl suture using the Endoclose technique. The lateral trocars were removed under visualization.   Before the umbilical trocar was removed the CO2 gas was released.  The fascia there was closed with 0 Vicryl suture in interrupted figure of eight technique.   The skin incisions were closed with subcuticular stitches and glue. The patient tolerated the procedure well.  Sponge, lap and needle counts were correct x2.  The patient was taken to recovery room in excellent condition.  Antibiotics: {Blank single:19197::"Given 1st or 2nd generation cephalosporin, Antibiotics given within 1 hour of the start of the procedure, Antibiotics ordered to be discontinued within 24 hours post procedure"   VTE prophylaxis: was ordered perioperatively with SCDs and SQ Heparin.  Mellody Drown, MD

## 2017-09-09 NOTE — Discharge Instructions (Signed)
AMBULATORY SURGERY  DISCHARGE INSTRUCTIONS   1) The drugs that you were given will stay in your system until tomorrow so for the next 24 hours you should not:  A) Drive an automobile B) Make any legal decisions C) Drink any alcoholic beverage   2) You may resume regular meals tomorrow.  Today it is better to start with liquids and gradually work up to solid foods.  You may eat anything you prefer, but it is better to start with liquids, then soup and crackers, and gradually work up to solid foods.   3) Please notify your doctor immediately if you have any unusual bleeding, trouble breathing, redness and pain at the surgery site, drainage, fever, or pain not relieved by medication. 4)   5) Your post-operative visit with Dr.                                     is: Date:                        Time:    Please call to schedule your post-operative visit.  6) Additional Instructions:      Discharge instructions:  Call office if you have any of the following: fever >101 F, chills, excessive vaginal bleeding, incision drainage or problems, leg pain or redness, or any other concerns.   Activity: Do not lift > 10 lbs for 8 weeks.  No intercourse or tampons for 8 weeks.  No driving for 1-2 weeks.   You may feel some pain in your upper right abdomen/rib and right shoulder.  This is from the gas in the abdomen for surgery. This will subside over time, please be patient!  Take 600mg  Ibuprofen and 1000mg  Tylenol around the clock, every 6 hours for at least the first 3-5 days.  After this you can take as needed.  This will help decrease inflammation and promote healing.  The narcotics you'll take just as needed, as they just trick your brain into thinking its not in pain.    Please don't limit yourself in terms of routine activity.  You will be able to do most things, although they may take longer to do or be a little painful.  You can do it!  Don't be a hero, but don't be a wimp either!

## 2017-09-09 NOTE — Anesthesia Preprocedure Evaluation (Signed)
Anesthesia Evaluation  Patient identified by MRN, date of birth, ID band Patient awake    Reviewed: Allergy & Precautions, NPO status , Patient's Chart, lab work & pertinent test results  Airway Mallampati: III  TM Distance: <3 FB     Dental  (+) Chipped   Pulmonary neg pulmonary ROS,    Pulmonary exam normal        Cardiovascular hypertension, Normal cardiovascular exam+ dysrhythmias      Neuro/Psych negative neurological ROS  negative psych ROS   GI/Hepatic Neg liver ROS, GERD  ,  Endo/Other  diabetes  Renal/GU negative Renal ROS  negative genitourinary   Musculoskeletal  (+) Arthritis , Osteoarthritis,    Abdominal Normal abdominal exam  (+)   Peds negative pediatric ROS (+)  Hematology negative hematology ROS (+)   Anesthesia Other Findings   Reproductive/Obstetrics                             Anesthesia Physical Anesthesia Plan  ASA: III  Anesthesia Plan: General   Post-op Pain Management:    Induction: Intravenous  PONV Risk Score and Plan:   Airway Management Planned: Oral ETT  Additional Equipment:   Intra-op Plan:   Post-operative Plan: Extubation in OR  Informed Consent: I have reviewed the patients History and Physical, chart, labs and discussed the procedure including the risks, benefits and alternatives for the proposed anesthesia with the patient or authorized representative who has indicated his/her understanding and acceptance.   Dental advisory given  Plan Discussed with: Surgeon and CRNA  Anesthesia Plan Comments:         Anesthesia Quick Evaluation

## 2017-09-11 LAB — CYTOLOGY - NON PAP

## 2017-09-16 LAB — SURGICAL PATHOLOGY

## 2017-10-14 ENCOUNTER — Inpatient Hospital Stay: Payer: Medicare Other | Attending: Obstetrics and Gynecology | Admitting: Obstetrics and Gynecology

## 2017-10-14 ENCOUNTER — Encounter: Payer: Self-pay | Admitting: Obstetrics and Gynecology

## 2017-10-14 VITALS — BP 176/86 | HR 66 | Temp 97.6°F | Resp 18 | Ht 60.0 in | Wt 165.8 lb

## 2017-10-14 DIAGNOSIS — Z90722 Acquired absence of ovaries, bilateral: Secondary | ICD-10-CM | POA: Insufficient documentation

## 2017-10-14 DIAGNOSIS — Z90710 Acquired absence of both cervix and uterus: Secondary | ICD-10-CM | POA: Insufficient documentation

## 2017-10-14 DIAGNOSIS — C541 Malignant neoplasm of endometrium: Secondary | ICD-10-CM | POA: Insufficient documentation

## 2017-10-14 DIAGNOSIS — C55 Malignant neoplasm of uterus, part unspecified: Secondary | ICD-10-CM

## 2017-10-14 NOTE — Progress Notes (Signed)
Chaperoned pelvic exam. Follow up in 3 months then can start rotation with Dr. Ilda Basset. Oncology Nurse Navigator Documentation  Navigator Location: [facility] (10/14/17 1400)   )Navigator Encounter Type: Follow-up Appt (10/14/17 1400)                                                    Time Spent with Patient: 15 (10/14/17 1400)

## 2017-10-14 NOTE — Progress Notes (Signed)
Fatigue getting better and she rests when she gets tired. Not regular but at times some bloating and distention.

## 2017-10-14 NOTE — Progress Notes (Signed)
Gynecologic Oncology Interval Visit   Referring Provider: Dr. Ilda Basset  Chief Complaint: Endometroid adenocarcinoma, grade 1  Subjective:  Claire Drake is a 77 y.o. [id number], post-menopausal, female with stage 1, grade 1 endometrial cancer seen in consultation from Clarene Reamer, FNP and Dr. Ilda Basset ([facility] at [facility]).   She underwent TLH, BSO, SLN mapping and biopsies on 09/09/2017. She presents for her postoperative visit.   Pathology  DIAGNOSIS:  A. UTERUS WITH CERVIX; HYSTERECTOMY:  - ENDOMETRIOID ADENOCARCINOMA WITH MUCINOUS FEATURES, FIGO GRADE 1.  - NEGATIVE FOR MYOMETRIAL INVASION, CERVICAL INVOLVEMENT, AND SEROSAL  INVOLVEMENT.  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  - INCIDENTAL 3 MM BENIGN INTRAMURAL MESENCHYMAL NODULE; ENDOMETRIAL  STROMAL NODULE VERSUS CELLULAR LEIOMYOMA.   BILATERAL FALLOPIAN TUBES AND OVARIES; SALPINGO-OOPHORECTOMY:  - NEGATIVE FOR MALIGNANCY.  - ATROPHY.   B. SENTINEL LYMPH NODE, LEFT EXTERNAL ILIAC; EXCISION:  - NEGATIVE FOR MALIGNANCY, ONE LYMPH NODE (0/1).   C. SENTINEL LYMPH NODE, RIGHT EXTERNAL ILIAC #1; EXCISION:  - NEGATIVE FOR MALIGNANCY, ONE LYMPH NODE (0/1).   D. SENTINEL LYMPH NODE, RIGHT EXTERNAL ILIAC #2; EXCISION:  - NEGATIVE FOR MALIGNANCY, TWO LYMPH NODES (0/2).   E. LYMPH NODE, LEFT PELVIC; EXCISION:  - NEGATIVE FOR MALIGNANCY, ONE LYMPH NODE (0/1).      MLH1: Intact nuclear expression  MSH2: Intact nuclear expression  MSH6: Intact nuclear expression  PMS2: Intact nuclear expression   Cytology negative  Gynecologic Oncology History  Claire Drake is a pleasant [phone], post-menopausal, female with who presented with grade 1 endometrial cancer seen in consultation from Clarene Reamer, FNP and Dr. Ilda Basset ([facility] at [facility]).Please see prior notes for complete details.   She underwent TLH, BSO, SLN mapping and biopsies on 09/09/2017.      Problem  List: Patient Active Problem List   Diagnosis Date Noted  . Irregular heart rhythm 08/28/2017  . Uterine cancer ([place]) 08/18/2017  . Vaginal discharge 08/13/2017  . Transient hypertension 08/13/2017  . Uncontrolled type 2 diabetes mellitus with hyperglycemia ([place]) 08/06/2017  . BCC (basal cell carcinoma), face 08/02/2013    Past Medical History: Past Medical History:  Diagnosis Date  . Arthritis   . Cancer ([place]) 08/2017   endometrial cancer; skin cancer on lip and one on head  . Dysrhythmia    seeing Dr. Rockey Situ 09/07/2017  . GERD (gastroesophageal reflux disease)    occasionally  . Hypertension 08/2017   just recently with new diagnosis  . Rectocele 2019  . Uncontrolled type 2 diabetes mellitus with hyperglycemia ([place]) 08/06/2017   A1C 8.7  07/2017    Past Surgical History: Past Surgical History:  Procedure Laterality Date  . APPENDECTOMY  1970  . CATARACT EXTRACTION, BILATERAL  05/12/2002  . CHOLECYSTECTOMY  1978  . LAPAROSCOPIC HYSTERECTOMY N/A 09/09/2017   Procedure: HYSTERECTOMY TOTAL LAPAROSCOPIC WITH REMOVAL OF OVARIES AND TUBES, SENTINEL LYMPH NODE MAPPING AND BIOPSIES;  Surgeon: Mellody Drown, MD;  Location: [facility] ORS;  Service: Gynecology;  Laterality: N/A;  . TUBAL LIGATION Bilateral 1980    Past Gynecologic History:  G3P2. Denies abnormal pap smears. Tubal ligation in 1980s. Not currently sexually active. Denies history of STIs. Denies HRT use.   OB History:  OB History  Gravida Para Term Preterm AB Living  _0 SAB TAB Ectopic Multiple Live Births  1       2    # Outcome Date GA  Lbr Len/2nd Weight Sex Delivery Anes PTL Lv  3 SAB           2 Term           1 Term             Obstetric Comments  svd x 2. sab x 1    Family History: Family History  Problem Relation Age of Onset  . Dementia Mother   . Heart disease Father   . Heart attack Father   . Diabetes Maternal Grandmother   . Heart attack Maternal Grandfather     Social  History: Social History   Socioeconomic History  . Marital status: Widowed    Spouse name: Not on file  . Number of children: Not on file  . Years of education: Not on file  . Highest education level: Not on file  Occupational History  . Not on file  Social Needs  . Financial resource strain: Not on file  . Food insecurity:    Worry: Not on file    Inability: Not on file  . Transportation needs:    Medical: Not on file    Non-medical: Not on file  Tobacco Use  . Smoking status: Never Smoker  . Smokeless tobacco: Never Used  Substance and Sexual Activity  . Alcohol use: No    Frequency: Never  . Drug use: No  . Sexual activity: Never  Lifestyle  . Physical activity:    Days per week: Not on file    Minutes per session: Not on file  . Stress: Not on file  Relationships  . Social connections:    Talks on phone: Not on file    Gets together: Not on file    Attends religious service: Not on file    Active member of club or organization: Not on file    Attends meetings of clubs or organizations: Not on file    Relationship status: Not on file  . Intimate partner violence:    Fear of current or ex partner: Not on file    Emotionally abused: Not on file    Physically abused: Not on file    Forced sexual activity: Not on file  Other Topics Concern  . Not on file  Social History Narrative  . Not on file    Allergies: Allergies  Allergen Reactions  . Seasonal Ic [Cholestatin] Other (See Comments)    Stuffy, nasal drainage    Current Medications: Current Outpatient Medications  Medication Sig Dispense Refill  . aspirin EC 81 MG tablet Take 81 mg by mouth daily.    . calcium elemental as carbonate (TUMS ULTRA 1000) 400 MG chewable tablet Chew 1,000 mg by mouth daily as needed for heartburn.    . diphenhydrAMINE (BENADRYL) 25 mg capsule Take 25 mg by mouth at bedtime as needed for allergies or sleep.    Marland Kitchen ibuprofen (ADVIL,MOTRIN) 600 MG tablet Take 1 tablet (600 mg  total) by mouth every 6 (six) hours. 65 tablet 1  . metFORMIN (GLUCOPHAGE) 500 MG tablet Take 1/2 tablet with dinner for 7 days, then take 1 tablet daily with dinner (Patient taking differently: Take 500 mg by mouth daily with supper. ) 90 tablet 3  . naproxen sodium (ALEVE) 220 MG tablet Take 220-440 mg by mouth daily as needed (pain).    Marland Kitchen oxyCODONE (ROXICODONE) 5 MG immediate release tablet Take 1 tablet (5 mg total) by mouth every 8 (eight) hours as needed. 16 tablet 0  No current facility-administered medications for this visit.     General: fatigue  HEENT: no complaints  Lungs: no complaints  Cardiac: no complaints  GI: abdominal bloating/distention  GU: no complaints  Musculoskeletal: no complaints  Extremities: no complaints  Skin: no complaints  Neuro: no complaints  Endocrine: no complaints  Psych: no complaints       Objective:  Physical Examination:  BP (!) 176/86   Pulse 66   Temp 97.6 F (36.4 C) (Tympanic)   Resp 18   Ht 5' (1.524 m)   Wt 165 lb 12.8 oz (75.2 kg)   BMI 32.38 kg/m     ECOG Performance Status: 1 - Symptomatic but completely ambulatory   General appearance: alert, cooperative and appears stated age. Unaccompanied.  HEENT: ATNC Abdomen: no palpable masses, no hernias, well healed incisions, soft, nontender, nondistended. Umbilicus normal. Incisions all healing well.  Extremities: no lower extremity edema Neurological exam reveals alert, oriented, normal speech, no focal findings or movement disorder noted  Pelvic: Exam chaperoned by RN: EGBUS/Vagina: large rectocoele, Cervix/Uterus surgically absent. Bimanual no masses, vaginal induration present c/w postop. RV deferred      Assessment:  Claire Drake is a 77 y.o. female diagnosed with stage Ia, grade 1 endometrioid endometrial cancer.  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  Medical co-morbidities complicating care: Atrial fibrillation, T2DM   Plan:   Problem List Items Addressed This  Visit      Genitourinary   Uterine cancer ([place]) - Primary     We discussed her pathology results with excellent prognosis. Risk of recurrence is <= 5%. No adjuvant therapy needed.  I have recommended continued close follow up with exams, including pelvic exams every 3-6 months for 2-3 years, then every 6-12 months for 3-5 years and then annually thereafter.  Imaging and laboratory assessment is based on clinical indication. Patient education for obesity, lifestyle, exercise, nutrition, sexual health, vaginal lubricants. We discussed her weight and need for weight loss, stressed good nutrition, and exercise.  I provided information regarding calorie counting and exercise for weight loss. I offered referral to the CARE program and provided a pamphlet today.  After her next visit we can begin alternating with Dr. Ilda Basset.    Gillis Ends, MD  CC:  Referring Provider: Dr. Ilda Basset

## 2017-10-19 ENCOUNTER — Other Ambulatory Visit: Payer: Self-pay

## 2017-11-06 ENCOUNTER — Encounter: Payer: Self-pay | Admitting: Family Medicine

## 2017-11-06 ENCOUNTER — Ambulatory Visit: Payer: Medicare Other | Admitting: Family Medicine

## 2017-11-06 VITALS — BP 138/78 | HR 64 | Temp 97.7°F | Ht 60.0 in | Wt 162.2 lb

## 2017-11-06 DIAGNOSIS — Z1231 Encounter for screening mammogram for malignant neoplasm of breast: Secondary | ICD-10-CM

## 2017-11-06 DIAGNOSIS — R030 Elevated blood-pressure reading, without diagnosis of hypertension: Secondary | ICD-10-CM | POA: Diagnosis not present

## 2017-11-06 DIAGNOSIS — E559 Vitamin D deficiency, unspecified: Secondary | ICD-10-CM | POA: Diagnosis not present

## 2017-11-06 DIAGNOSIS — E2839 Other primary ovarian failure: Secondary | ICD-10-CM | POA: Diagnosis not present

## 2017-11-06 DIAGNOSIS — E1165 Type 2 diabetes mellitus with hyperglycemia: Secondary | ICD-10-CM | POA: Diagnosis not present

## 2017-11-06 DIAGNOSIS — Z1239 Encounter for other screening for malignant neoplasm of breast: Secondary | ICD-10-CM

## 2017-11-06 LAB — VITAMIN D 25 HYDROXY (VIT D DEFICIENCY, FRACTURES): VITD: 14.58 ng/mL — ABNORMAL LOW (ref 30.00–100.00)

## 2017-11-06 LAB — HEMOGLOBIN A1C: Hgb A1c MFr Bld: 7.2 % — ABNORMAL HIGH (ref 4.6–6.5)

## 2017-11-06 MED ORDER — METFORMIN HCL 500 MG PO TABS: 500.0000 mg | ORAL_TABLET | Freq: Every day | ORAL | 3 refills | Status: DC

## 2017-11-06 MED ORDER — VITAMIN D (ERGOCALCIFEROL) 1.25 MG (50000 UNIT) PO CAPS: 50000.0000 [IU] | ORAL_CAPSULE | ORAL | 3 refills | Status: DC

## 2017-11-06 NOTE — Addendum Note (Signed)
Addended by: Clarene Reamer B on: 11/06/2017 12:10 PM   Modules accepted: Orders

## 2017-11-06 NOTE — Patient Instructions (Signed)
Stop at desk to schedule mammogram/bone density

## 2017-11-06 NOTE — Progress Notes (Signed)
Subjective:    Patient ID: Claire Drake, female    DOB: 1940/10/06, 77 y.o.   MRN: [id number]  HPI This is a 77 yo female who presents today for follow up of HTN.  Has done well since hysterectomy. Has resumed volunteering.   DM- checking blood sugars at home, 150s, doesn't check often. Tolerating metformin without side efects. Walking daily at least a mile.   HTN- no problems previously, has had elevations during surgical process. Coming down now.   Vitamin D def- has taking high dose weekly replacement, will recheck today.  She denies chest pain, SOB, dysuria, constipation/diarrhea, some occasional bilateral knee pain. Does not like to take medication.      Past Medical History:  Diagnosis Date  . Arthritis   . Cancer ([place]) 08/2017   endometrial cancer; skin cancer on lip and one on head  . Dysrhythmia    seeing Dr. Rockey Situ 09/07/2017  . GERD (gastroesophageal reflux disease)    occasionally  . Hypertension 08/2017   just recently with new diagnosis  . Rectocele 2019  . Uncontrolled type 2 diabetes mellitus with hyperglycemia ([place]) 08/06/2017   A1C 8.7  07/2017   Past Surgical History:  Procedure Laterality Date  . APPENDECTOMY  1970  . CATARACT EXTRACTION, BILATERAL  05/12/2002  . CHOLECYSTECTOMY  1978  . LAPAROSCOPIC HYSTERECTOMY N/A 09/09/2017   Procedure: HYSTERECTOMY TOTAL LAPAROSCOPIC WITH REMOVAL OF OVARIES AND TUBES, SENTINEL LYMPH NODE MAPPING AND BIOPSIES;  Surgeon: Mellody Drown, MD;  Location: [facility] ORS;  Service: Gynecology;  Laterality: N/A;  . TUBAL LIGATION Bilateral 1980   Family History  Problem Relation Age of Onset  . Dementia Mother   . Heart disease Father   . Heart attack Father   . Diabetes Maternal Grandmother   . Heart attack Maternal Grandfather       Review of Systems Per HPI    Objective:   Physical Exam  Constitutional: She is oriented to person, place, and time. She appears well-developed and well-nourished. No distress.  HENT:    Head: Normocephalic and atraumatic.  Cardiovascular: Normal rate, regular rhythm and normal heart sounds.  Occasional early beat.   Pulmonary/Chest: Effort normal and breath sounds normal.  Musculoskeletal: She exhibits no edema.  Neurological: She is alert and oriented to person, place, and time.  Skin: Skin is warm and dry. She is not diaphoretic.  Psychiatric: She has a normal mood and affect. Her behavior is normal. Judgment and thought content normal.  Vitals reviewed.     BP (!) 150/98 (BP Location: Left Arm, Patient Position: Sitting, Cuff Size: Normal)   Pulse 64   Temp 97.7 F (36.5 C) (Oral)   Ht 5' (1.524 m)   Wt 162 lb 4 oz (73.6 kg)   SpO2 97%   BMI 31.69 kg/m  Wt Readings from Last 3 Encounters:  11/06/17 162 lb 4 oz (73.6 kg)  10/14/17 165 lb 12.8 oz (75.2 kg)  09/09/17 162 lb (73.5 kg)   BP Readings from Last 3 Encounters:  11/06/17 (!) 150/98  10/14/17 (!) 176/86  09/09/17 (!) 175/98   Recheck BP: 138/78        Assessment & Plan:  1. Uncontrolled type 2 diabetes mellitus with hyperglycemia (HCC) - Hemoglobin A1c  2. Vitamin D deficiency - VITAMIN D 25 Hydroxy (Vit-D Deficiency, Fractures)  3. Screening for breast cancer - MM 3D SCREEN BREAST BILATERAL; Future  4. Estrogen deficiency - DG Bone Density; Future  5. Elevated blood pressure  reading - improved with recheck, will continue to monitor  - follow up in 6 months Clarene Reamer, FNP-BC   Primary Care at [facility], [place]  11/06/2017 12:02 PM

## 2017-12-08 ENCOUNTER — Ambulatory Visit
Admission: RE | Admit: 2017-12-08 | Discharge: 2017-12-08 | Disposition: A | Discharge status: Home / Self Care | Payer: Medicare Other | Source: Ambulatory Visit | Attending: Family Medicine | Admitting: Family Medicine

## 2017-12-08 DIAGNOSIS — Z1239 Encounter for other screening for malignant neoplasm of breast: Secondary | ICD-10-CM

## 2017-12-08 DIAGNOSIS — Z1231 Encounter for screening mammogram for malignant neoplasm of breast: Secondary | ICD-10-CM | POA: Insufficient documentation

## 2017-12-08 DIAGNOSIS — E2839 Other primary ovarian failure: Secondary | ICD-10-CM | POA: Diagnosis not present

## 2018-01-13 ENCOUNTER — Ambulatory Visit: Payer: Medicare Other

## 2018-01-20 ENCOUNTER — Inpatient Hospital Stay: Payer: Medicare Other | Attending: Obstetrics and Gynecology | Admitting: Obstetrics and Gynecology

## 2018-01-20 VITALS — BP 183/86 | HR 81 | Temp 98.9°F | Resp 18 | Ht 60.0 in | Wt 159.9 lb

## 2018-01-20 DIAGNOSIS — Z8542 Personal history of malignant neoplasm of other parts of uterus: Secondary | ICD-10-CM | POA: Insufficient documentation

## 2018-01-20 DIAGNOSIS — Z90710 Acquired absence of both cervix and uterus: Secondary | ICD-10-CM | POA: Insufficient documentation

## 2018-01-20 DIAGNOSIS — Z08 Encounter for follow-up examination after completed treatment for malignant neoplasm: Secondary | ICD-10-CM | POA: Diagnosis not present

## 2018-01-20 DIAGNOSIS — Z90722 Acquired absence of ovaries, bilateral: Secondary | ICD-10-CM | POA: Insufficient documentation

## 2018-01-20 DIAGNOSIS — C55 Malignant neoplasm of uterus, part unspecified: Secondary | ICD-10-CM

## 2018-01-20 NOTE — Patient Instructions (Signed)
See Dr Nilda Riggs in 6 months

## 2018-01-20 NOTE — Progress Notes (Signed)
Gynecologic Oncology Interval Visit   Referring Provider: Dr. Ilda Basset  Chief Complaint: Endometroid adenocarcinoma, grade 1  Subjective:  Claire Drake is a 77 y.o. [id number], post-menopausal, female with stage 1, grade 1 endometrial cancer seen in consultation from Clarene Reamer, FNP and Dr. Ilda Basset ([facility] at [facility]).   She underwent TLH, BSO, SLN mapping and biopsies on 09/09/2017. She presents for surveillance visit.   Pathology  DIAGNOSIS:  A. UTERUS WITH CERVIX; HYSTERECTOMY:  - ENDOMETRIOID ADENOCARCINOMA WITH MUCINOUS FEATURES, FIGO GRADE 1.  - NEGATIVE FOR MYOMETRIAL INVASION, CERVICAL INVOLVEMENT, AND SEROSAL  INVOLVEMENT.  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  - INCIDENTAL 3 MM BENIGN INTRAMURAL MESENCHYMAL NODULE; ENDOMETRIAL  STROMAL NODULE VERSUS CELLULAR LEIOMYOMA.   BILATERAL FALLOPIAN TUBES AND OVARIES; SALPINGO-OOPHORECTOMY:  - NEGATIVE FOR MALIGNANCY.  - ATROPHY.   B. SENTINEL LYMPH NODE, LEFT EXTERNAL ILIAC; EXCISION:  - NEGATIVE FOR MALIGNANCY, ONE LYMPH NODE (0/1).   C. SENTINEL LYMPH NODE, RIGHT EXTERNAL ILIAC #1; EXCISION:  - NEGATIVE FOR MALIGNANCY, ONE LYMPH NODE (0/1).   D. SENTINEL LYMPH NODE, RIGHT EXTERNAL ILIAC #2; EXCISION:  - NEGATIVE FOR MALIGNANCY, TWO LYMPH NODES (0/2).   E. LYMPH NODE, LEFT PELVIC; EXCISION:  - NEGATIVE FOR MALIGNANCY, ONE LYMPH NODE (0/1).      MLH1: Intact nuclear expression  MSH2: Intact nuclear expression  MSH6: Intact nuclear expression  PMS2: Intact nuclear expression   Cytology negative  Gynecologic Oncology History  Claire Drake is a [id number], post-menopausal, female with who presented with grade 1 endometrial cancer seen in consultation from Clarene Reamer, FNP and Dr. Ilda Basset [facility] at [facility]).Please see prior notes for complete details.   She underwent TLH, BSO, SLN mapping and biopsies on 09/09/2017.      Problem List: Patient  Active Problem List   Diagnosis Date Noted  . Irregular heart rhythm 08/28/2017  . Uterine cancer ([place]) 08/18/2017  . Vaginal discharge 08/13/2017  . Transient hypertension 08/13/2017  . Uncontrolled type 2 diabetes mellitus with hyperglycemia ([place]) 08/06/2017  . BCC (basal cell carcinoma), face 08/02/2013    Past Medical History: Past Medical History:  Diagnosis Date  . Arthritis   . Cancer ([place]) 08/2017   endometrial cancer; skin cancer on lip and one on head  . Dysrhythmia    seeing Dr. Rockey Situ 09/07/2017  . GERD (gastroesophageal reflux disease)    occasionally  . Hypertension 08/2017   just recently with new diagnosis  . Rectocele 2019  . Uncontrolled type 2 diabetes mellitus with hyperglycemia ([place]) 08/06/2017   A1C 8.7  07/2017    Past Surgical History: Past Surgical History:  Procedure Laterality Date  . ABDOMINAL HYSTERECTOMY    . APPENDECTOMY  1970  . CATARACT EXTRACTION, BILATERAL  05/12/2002  . CHOLECYSTECTOMY  1978  . LAPAROSCOPIC HYSTERECTOMY N/A 09/09/2017   Procedure: HYSTERECTOMY TOTAL LAPAROSCOPIC WITH REMOVAL OF OVARIES AND TUBES, SENTINEL LYMPH NODE MAPPING AND BIOPSIES;  Surgeon: Mellody Drown, MD;  Location: [facility] ORS;  Service: Gynecology;  Laterality: N/A;  . TUBAL LIGATION Bilateral 1980    Past Gynecologic History:  G3P2. Denies abnormal pap smears. Tubal ligation in 1980s. Not currently sexually active. Denies history of STIs. Denies HRT use.   OB History:  OB History  Gravida Para Term Preterm AB Living  '3 2 2   1 2  '$ SAB TAB Ectopic Multiple Live Births  1       2    #  Outcome Date GA Lbr Len/2nd Weight Sex Delivery Anes PTL Lv  3 SAB           2 Term           1 Term             Obstetric Comments  svd x 2. sab x 1    Family History: Family History  Problem Relation Age of Onset  . Dementia Mother   . Heart disease Father   . Heart attack Father   . Diabetes Maternal Grandmother   . Heart attack Maternal Grandfather   . Breast  cancer Neg Hx     Social History: Social History   Socioeconomic History  . Marital status: Widowed    Spouse name: Not on file  . Number of children: Not on file  . Years of education: Not on file  . Highest education level: Not on file  Occupational History  . Not on file  Social Needs  . Financial resource strain: Not on file  . Food insecurity:    Worry: Not on file    Inability: Not on file  . Transportation needs:    Medical: Not on file    Non-medical: Not on file  Tobacco Use  . Smoking status: Never Smoker  . Smokeless tobacco: Never Used  Substance and Sexual Activity  . Alcohol use: No    Frequency: Never  . Drug use: No  . Sexual activity: Never  Lifestyle  . Physical activity:    Days per week: Not on file    Minutes per session: Not on file  . Stress: Not on file  Relationships  . Social connections:    Talks on phone: Not on file    Gets together: Not on file    Attends religious service: Not on file    Active member of club or organization: Not on file    Attends meetings of clubs or organizations: Not on file    Relationship status: Not on file  . Intimate partner violence:    Fear of current or ex partner: Not on file    Emotionally abused: Not on file    Physically abused: Not on file    Forced sexual activity: Not on file  Other Topics Concern  . Not on file  Social History Narrative  . Not on file    Allergies: Allergies  Allergen Reactions  . Seasonal Ic [Cholestatin] Other (See Comments)    Stuffy, nasal drainage    Current Medications: Current Outpatient Medications  Medication Sig Dispense Refill  . aspirin EC 81 MG tablet Take 81 mg by mouth daily.    . calcium elemental as carbonate (TUMS ULTRA 1000) 400 MG chewable tablet Chew 1,000 mg by mouth daily as needed for heartburn.    . diphenhydrAMINE (BENADRYL) 25 mg capsule Take 25 mg by mouth at bedtime as needed for allergies or sleep.    Marland Kitchen ibuprofen (ADVIL,MOTRIN) 600 MG  tablet Take 1 tablet (600 mg total) by mouth every 6 (six) hours. 65 tablet 1  . metFORMIN (GLUCOPHAGE) 500 MG tablet Take 1 tablet (500 mg total) by mouth daily with supper. 90 tablet 3  . naproxen sodium (ALEVE) 220 MG tablet Take 220-440 mg by mouth daily as needed (pain).    . Vitamin D, Ergocalciferol, (DRISDOL) 50000 units CAPS capsule Take 1 capsule (50,000 Units total) by mouth every 7 (seven) days. 12 capsule 3   No current facility-administered medications for this  visit.     General: fatigue  HEENT: no complaints  Lungs: no complaints  Cardiac: no complaints  GI: abdominal bloating/distention  GU: no complaints  Musculoskeletal: no complaints  Extremities: no complaints  Skin: no complaints  Neuro: no complaints  Endocrine: no complaints  Psych: no complaints     Objective:  Physical Examination:  BP (!) 183/86 (BP Location: Right Arm, Patient Position: Sitting)   Pulse 81   Temp 98.9 F (37.2 C) (Tympanic)   Resp 18   Ht 5' (1.524 m)   Wt 159 lb 14.4 oz (72.5 kg)   SpO2 97%   BMI 31.23 kg/m     ECOG Performance Status: 0 - Asymptomatic   General appearance: alert, cooperative and appears stated age. Unaccompanied.  HEENT: ATNC Abdomen: no palpable masses, no hernias, well healed incisions, soft, nontender, nondistended. Umbilicus normal. Incisions all healing well.  Extremities: no lower extremity edema Neurological exam reveals alert, oriented, normal speech, no focal findings or movement disorder noted  Pelvic: Exam chaperoned by RN: EGBUS/Vagina: large rectocoele, Cervix/Uterus surgically absent. Bimanual no masses, vaginal induration present c/w postop. RV deferred.    Assessment:  Claire Drake is a 77 y.o. female diagnosed with stage Ia, grade 1 endometrioid endometrial cancer with no myometrial invasion. NED.  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   Medical co-morbidities complicating care: Atrial fibrillation, T2DM   Plan:   Problem List Items  Addressed This Visit      Genitourinary   Uterine cancer ([place]) - Primary     Risk of recurrence is <= 5%. No adjuvant therapy needed.  She will alternate follow up with Dr Ilda Basset q 6 months.  She will see him in 6 months and [place] in a year.  Can return sooner if any concerning symptoms arise.    Mellody Drown, MD  CC:  Referring Provider: Dr. Ilda Basset

## 2018-03-26 ENCOUNTER — Encounter: Payer: Self-pay | Admitting: Family Medicine

## 2018-03-26 ENCOUNTER — Ambulatory Visit (INDEPENDENT_AMBULATORY_CARE_PROVIDER_SITE_OTHER): Payer: Medicare Other | Admitting: Family Medicine

## 2018-03-26 VITALS — BP 168/90 | HR 79 | Ht 60.0 in | Wt 161.8 lb

## 2018-03-26 DIAGNOSIS — R030 Elevated blood-pressure reading, without diagnosis of hypertension: Secondary | ICD-10-CM | POA: Diagnosis not present

## 2018-03-26 DIAGNOSIS — E1165 Type 2 diabetes mellitus with hyperglycemia: Secondary | ICD-10-CM | POA: Diagnosis not present

## 2018-03-26 DIAGNOSIS — M810 Age-related osteoporosis without current pathological fracture: Secondary | ICD-10-CM | POA: Insufficient documentation

## 2018-03-26 DIAGNOSIS — E559 Vitamin D deficiency, unspecified: Secondary | ICD-10-CM | POA: Diagnosis not present

## 2018-03-26 LAB — COMPREHENSIVE METABOLIC PANEL
ALBUMIN: 4.3 g/dL (ref 3.5–5.2)
ALK PHOS: 63 U/L (ref 39–117)
ALT: 15 U/L (ref 0–35)
AST: 18 U/L (ref 0–37)
BUN: 16 mg/dL (ref 6–23)
CHLORIDE: 103 meq/L (ref 96–112)
CO2: 28 mEq/L (ref 19–32)
CREATININE: 0.72 mg/dL (ref 0.40–1.20)
Calcium: 9.8 mg/dL (ref 8.4–10.5)
GFR: 83.15 mL/min (ref >60.00)
GLUCOSE: 131 mg/dL — AB (ref 70–99)
Potassium: 4.6 mEq/L (ref 3.5–5.1)
Sodium: 140 mEq/L (ref 135–145)
Total Bilirubin: 0.7 mg/dL (ref 0.2–1.2)
Total Protein: 6.9 g/dL (ref 6.0–8.3)

## 2018-03-26 LAB — VITAMIN D 25 HYDROXY (VIT D DEFICIENCY, FRACTURES): VITD: 37.4 ng/mL (ref 30.00–100.00)

## 2018-03-26 LAB — HEMOGLOBIN A1C: HEMOGLOBIN A1C: 6.7 % — AB (ref 4.6–6.5)

## 2018-03-26 NOTE — Progress Notes (Signed)
Subjective:    Patient ID: Claire Drake, female    DOB: 1940-12-27, 77 y.o.   MRN: [id number]  HPI This is a 77 yo female who presents today for follow up of chronic medical conditions.   Diabetes mellitus- continues to watch diet, walk, active in her house and yard  Endometrial cancer- had hysterectomy 5/19, endurance continues to improve. Hasn't slept as well since surgery but this is getting better. Naps as needed.   Elevated blood pressure- has been elevated since her surgery but has been coming down. She is very reluctant to take medication.   Osteoporosis- has been replacing vitamin D and once at normal, will add bisphosphate.   She has not had any recent illness, no cough, no chest pain, no SOB or LE edema. Occasional nasal congestion, does not take anything for symptoms.   Past Medical History:  Diagnosis Date  . Arthritis   . Cancer ([place]) 08/2017   endometrial cancer; skin cancer on lip and one on head  . Dysrhythmia    seeing Dr. Rockey Situ 09/07/2017  . GERD (gastroesophageal reflux disease)    occasionally  . Hypertension 08/2017   just recently with new diagnosis  . Rectocele 2019  . Uncontrolled type 2 diabetes mellitus with hyperglycemia ([place]) 08/06/2017   A1C 8.7  07/2017   Past Surgical History:  Procedure Laterality Date  . ABDOMINAL HYSTERECTOMY    . APPENDECTOMY  1970  . CATARACT EXTRACTION, BILATERAL  05/12/2002  . CHOLECYSTECTOMY  1978  . LAPAROSCOPIC HYSTERECTOMY N/A 09/09/2017   Procedure: HYSTERECTOMY TOTAL LAPAROSCOPIC WITH REMOVAL OF OVARIES AND TUBES, SENTINEL LYMPH NODE MAPPING AND BIOPSIES;  Surgeon: Mellody Drown, MD;  Location: [facility] ORS;  Service: Gynecology;  Laterality: N/A;  . TUBAL LIGATION Bilateral 1980   Family History  Problem Relation Age of Onset  . Dementia Mother   . Heart disease Father   . Heart attack Father   . Diabetes Maternal Grandmother   . Heart attack Maternal Grandfather   . Breast cancer Neg Hx    Social History    Tobacco Use  . Smoking status: Never Smoker  . Smokeless tobacco: Never Used  Substance Use Topics  . Alcohol use: No    Frequency: Never  . Drug use: No      Review of Systems Per HPI    Objective:   Physical Exam Physical Exam  Constitutional: Oriented to person, place, and time. She appears well-developed and well-nourished.  HENT:  Head: Normocephalic and atraumatic.  Eyes: Conjunctivae are normal.  Neck: Normal range of motion. Neck supple.  Cardiovascular: Normal rate, regular rhythm and normal heart sounds.   Pulmonary/Chest: Effort normal and breath sounds normal.  Musculoskeletal: No edema.  Neurological: Alert and oriented to person, place, and time.  Skin: Skin is warm and dry.  Psychiatric: Normal mood and affect. Behavior is normal. Judgment and thought content normal.  Vitals reviewed.    .BP (!) 168/90   Pulse 79   Ht 5' (1.524 m)   Wt 161 lb 12.8 oz (73.4 kg)   SpO2 97%   BMI 31.60 kg/m  Wt Readings from Last 3 Encounters:  03/26/18 161 lb 12.8 oz (73.4 kg)  01/20/18 159 lb 14.4 oz (72.5 kg)  11/06/17 162 lb 4 oz (73.6 kg)    BP Readings from Last 3 Encounters:  01/20/18 (!) 183/86  11/06/17 138/78  10/14/17 (!) 176/86       Assessment & Plan:  1. Uncontrolled type 2 diabetes  mellitus with hyperglycemia (HCC) - Hemoglobin A1c - Comprehensive metabolic panel  2. Vitamin D deficiency - VITAMIN D 25 Hydroxy (Vit-D Deficiency, Fractures) - Comprehensive metabolic panel  3. Transient hypertension - will recheck at next visit  4. Age-related osteoporosis without current pathological fracture - has been taking vitamin D 50,000 weekly, once replaced will add bisphospate - VITAMIN D 25 Hydroxy (Vit-D Deficiency, Fractures)  - follow up in 3 months  Clarene Reamer, FNP-BC  [facility]  03/26/2018 8:19 AM

## 2018-03-26 NOTE — Patient Instructions (Signed)
Good to see you today  Alendronate oral solution What is this medicine? ALENDRONATE (a LEN droe nate) slows calcium loss from bones. It helps to make normal healthy bone and to slow bone loss in people with Paget's disease and osteoporosis. It may be used in others at risk for bone loss. This medicine may be used for other purposes; ask your health care provider or pharmacist if you have questions. COMMON BRAND NAME(S): Fosamax What should I tell my health care provider before I take this medicine? They need to know if you have any of these conditions: -esophagus, stomach, or intestine problems, like acid reflux or GERD -dental disease -kidney disease -low blood calcium -low vitamin D -problems swallowing -problems sitting or standing for 30 minutes -an unusual or allergic reaction to alendronate, other medicines, foods, dyes, or preservatives -pregnant or trying to get pregnant -breast-feeding How should I use this medicine? You must take this medicine exactly as directed or you will lower the amount of the medicine you absorb into your body or you may cause yourself harm. Take your dose by mouth first thing in the morning, after you are up for the day. Do not eat or drink anything before you take this solution. Use a specially marked spoon or container to measure the oral solution. Take the solution with 2 fluid ounces (1/4 cup) of plain water. Do not take the solution with any other drink. After taking this medicine do not eat breakfast, drink, or take any other medicines or vitamins for at least 30 minutes. Stand or sit up for at least 30 minutes after you take this medicine; do not lie down. Do not take your medicine more often than directed. Take this medicine on the same day every week. Talk to your pediatrician regarding the use of this medicine in children. Special care may be needed. Overdosage: If you think you have taken too much of this medicine contact a poison control center or  emergency room at once. NOTE: This medicine is only for you. Do not share this medicine with others. What if I miss a dose? If you miss a dose, take the dose on the morning after you remember. Then take your next dose on your regular day of the week. Never take 2 doses on the same day. Do not take double or extra doses. What may interact with this medicine? -aluminum hydroxide -antacids -aspirin -calcium supplements -drugs for inflammation like ibuprofen, naproxen, and others -iron supplements -magnesium supplements -vitamins with minerals This list may not describe all possible interactions. Give your health care provider a list of all the medicines, herbs, non-prescription drugs, or dietary supplements you use. Also tell them if you smoke, drink alcohol, or use illegal drugs. Some items may interact with your medicine. What should I watch for while using this medicine? Visit your doctor for regular check ups. It may be some time before you see benefit from this medicine. Do not stop taking your medicine unless your doctor tells you to. Your doctor or health care professional may order regular blood tests to see how you are doing. You should make sure you get enough calcium and vitamin D in your diet while you are taking this medicine, unless your doctor tells you not to. Discuss the foods you eat and the vitamins you take with your health care professional. Some people who take this medicine have severe bone, joint, and/or muscle pain. This medicine may also increase your risk for a broken thigh bone. Tell  your doctor right away if you have pain in your upper leg or groin. Tell your doctor if you have any pain that does not go away or that gets worse. This medicine can make you more sensitive to the sun. If you get a rash while taking this medicine, sunlight may cause the rash to get worse. Keep out of the sun. If you cannot avoid being in the sun, wear protective clothing and use sunscreen. Do  not use sun lamps or tanning beds/booths. What side effects may I notice from receiving this medicine? Side effects that you should report to your doctor or health care professional as soon as possible: -allergic reactions like skin rash, itching or hives, swelling of the face, lips, or tongue -black or tarry stools -bone, muscle, or joint pain -changes in vision -chest pain -heartburn or stomach pain -jaw pain, especially after dental work -redness, blistering, peeling or loosening of the skin, including inside the mouth -trouble or pain when swallowing Side effects that usually do not require medical attention (report to your doctor or health care professional if they continue or are bothersome): -changes in taste -diarrhea or constipation -eye pain or itching -headache -nausea or vomiting -stomach gas or fullness This list may not describe all possible side effects. Call your doctor for medical advice about side effects. You may report side effects to FDA at 1-800-FDA-1088. Where should I keep my medicine? Keep out of the reach of children. Store at room temperature of 15 and 30 degrees C (59 and 86 degrees F). Throw away any unused medicine after the expiration date. NOTE: This sheet is a summary. It may not cover all possible information. If you have questions about this medicine, talk to your doctor, pharmacist, or health care provider.  2018 Elsevier/Gold Standard (2010-10-25 08:54:51)

## 2018-04-02 MED ORDER — ALENDRONATE SODIUM 70 MG PO TABS: 70.0000 mg | ORAL_TABLET | ORAL | 3 refills | Status: DC

## 2018-04-02 NOTE — Addendum Note (Signed)
Addended by: Clarene Reamer B on: 04/02/2018 01:23 PM   Modules accepted: Orders

## 2018-08-03 ENCOUNTER — Ambulatory Visit: Payer: Medicare Other

## 2018-08-04 ENCOUNTER — Other Ambulatory Visit: Payer: Self-pay | Admitting: Family Medicine

## 2018-08-04 DIAGNOSIS — E559 Vitamin D deficiency, unspecified: Secondary | ICD-10-CM

## 2018-08-06 ENCOUNTER — Encounter: Payer: Medicare Other | Admitting: Family Medicine

## 2018-08-25 IMAGING — CR DG CHEST 2V
2 series · 2 of 2 positions shown · non-contrast
Comparison: None.

CLINICAL DATA: Preop hysterectomy.

EXAM:
CHEST - 2 VIEW

[chest pa]
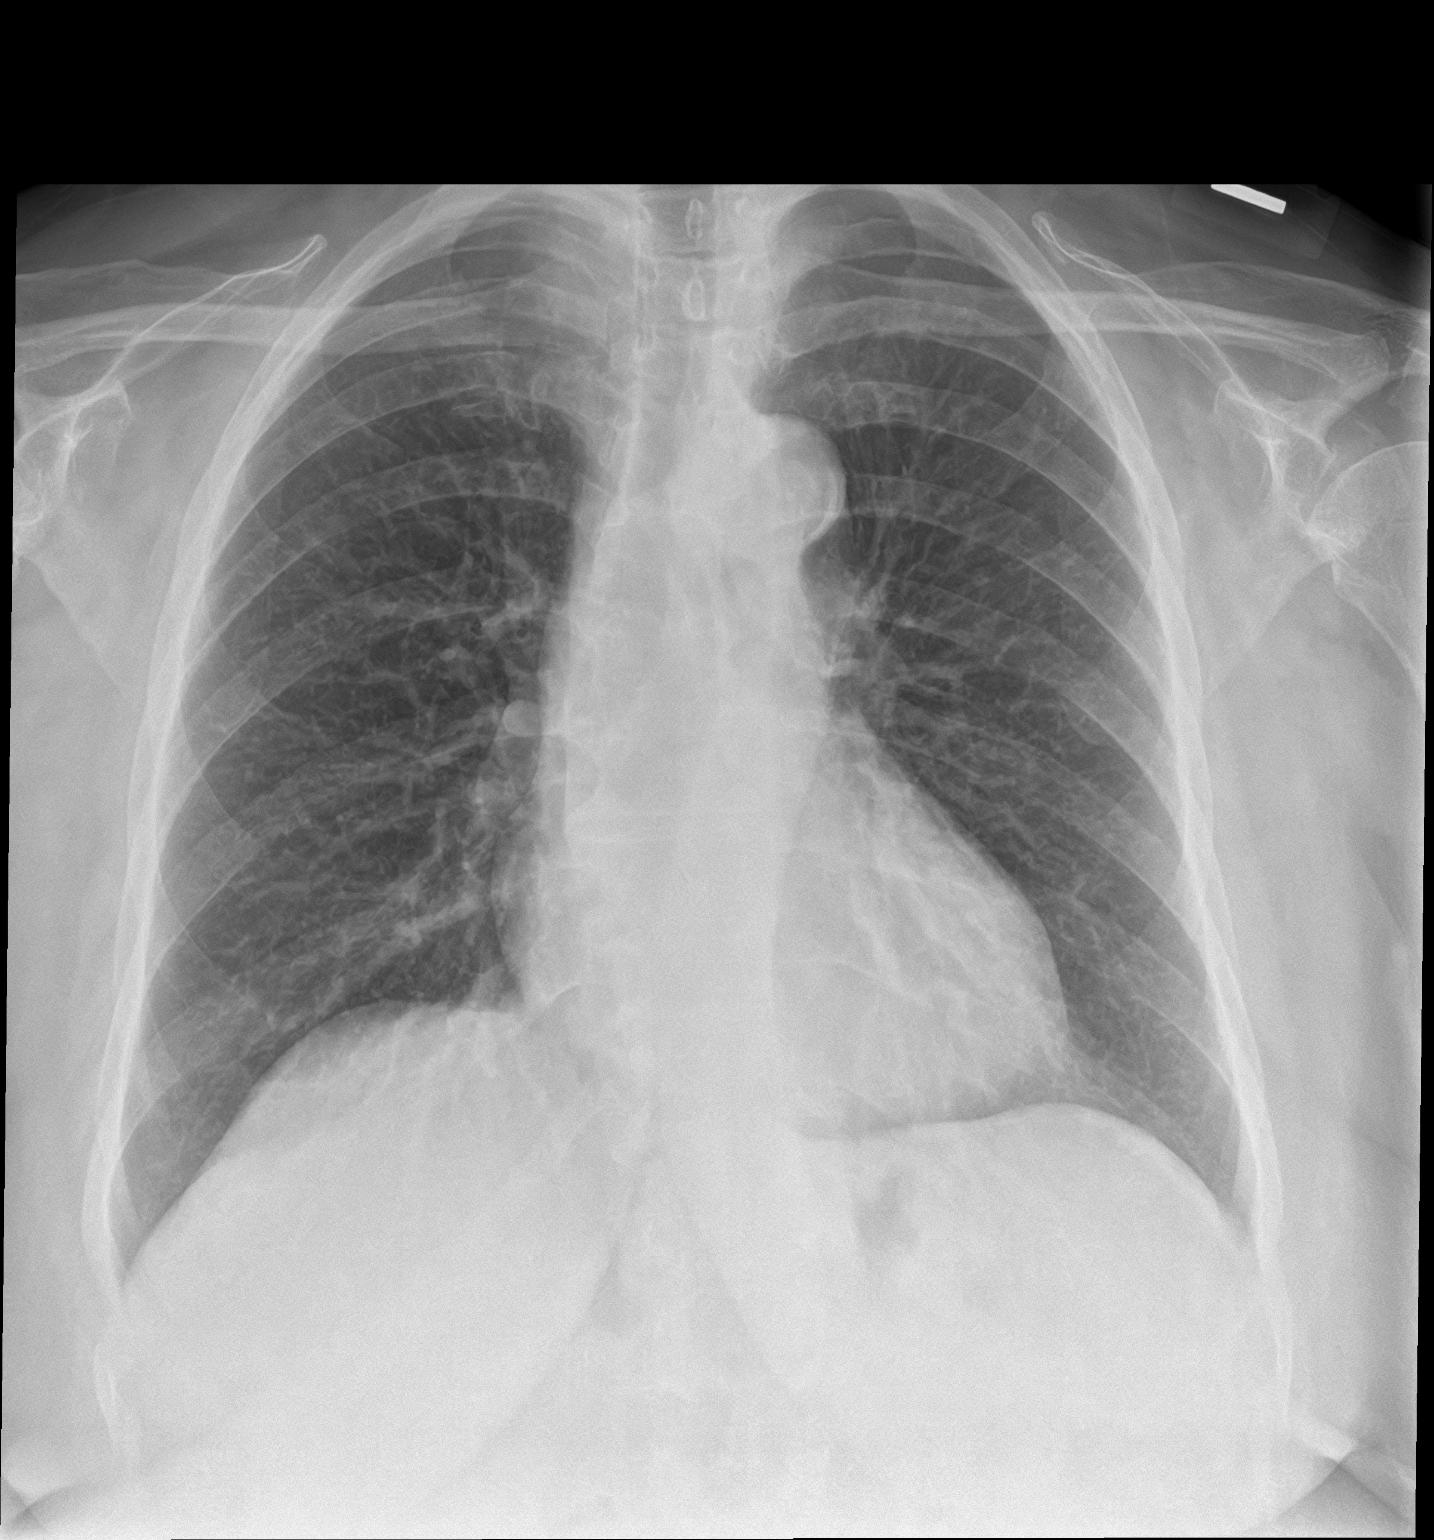

[chest lat]
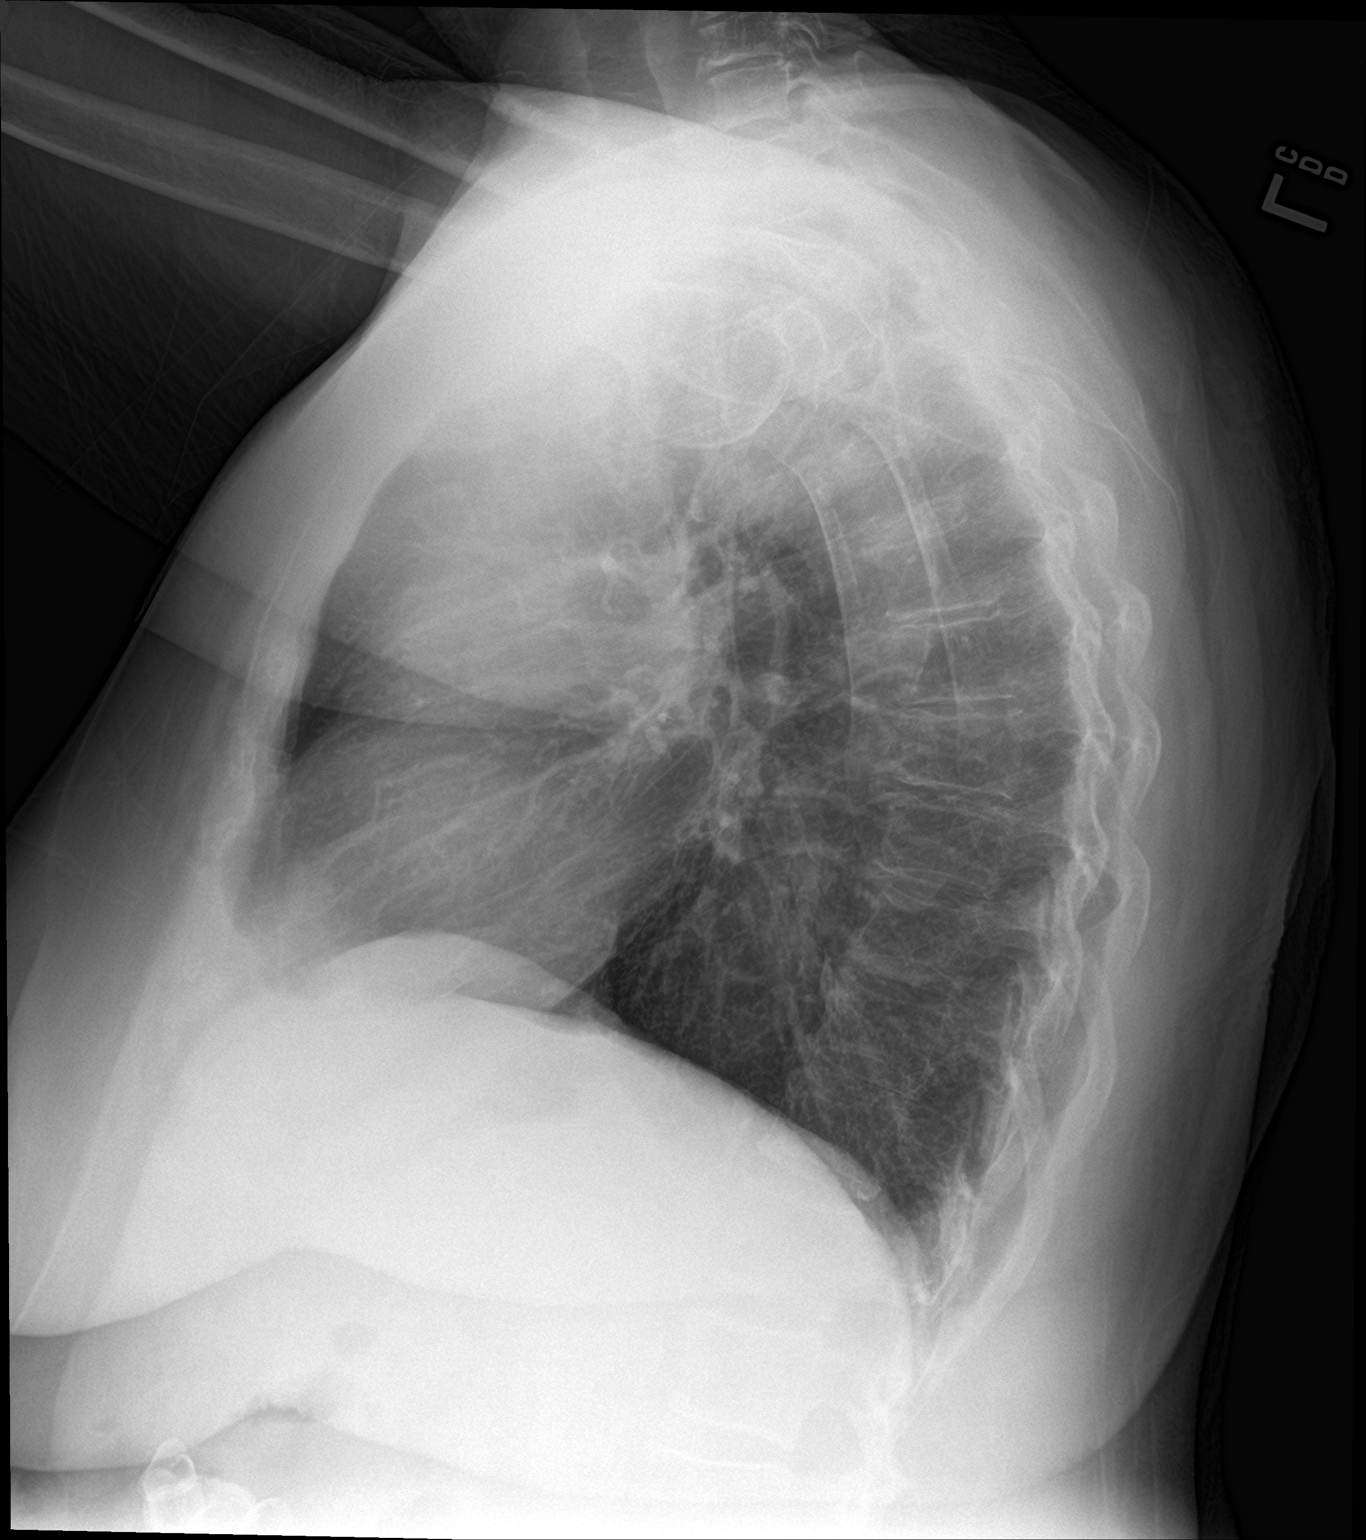

[2 of 2 positions shown; findings below may reference images not displayed]

FINDINGS: Lungs are adequately inflated without consolidation or effusion.
Cardiomediastinal silhouette is within normal. There is mild
calcified plaque over the aortic arch. Degenerative change of the
spine. Subtle anterior wedging of a midthoracic vertebral body.
IMPRESSION: No active cardiopulmonary disease.

## 2018-09-06 ENCOUNTER — Ambulatory Visit: Payer: Medicare Other

## 2018-11-15 ENCOUNTER — Other Ambulatory Visit: Payer: Self-pay

## 2018-11-15 DIAGNOSIS — E1165 Type 2 diabetes mellitus with hyperglycemia: Secondary | ICD-10-CM

## 2018-11-15 MED ORDER — METFORMIN HCL 500 MG PO TABS: 500.0000 mg | ORAL_TABLET | Freq: Every day | ORAL | 0 refills | Status: DC

## 2018-12-08 ENCOUNTER — Other Ambulatory Visit: Payer: Self-pay | Admitting: Family Medicine

## 2018-12-08 DIAGNOSIS — E119 Type 2 diabetes mellitus without complications: Secondary | ICD-10-CM

## 2018-12-16 ENCOUNTER — Other Ambulatory Visit (INDEPENDENT_AMBULATORY_CARE_PROVIDER_SITE_OTHER): Payer: Medicare Other

## 2018-12-16 ENCOUNTER — Ambulatory Visit: Payer: Medicare Other

## 2018-12-16 DIAGNOSIS — E119 Type 2 diabetes mellitus without complications: Secondary | ICD-10-CM | POA: Diagnosis not present

## 2018-12-16 LAB — COMPREHENSIVE METABOLIC PANEL
ALT: 12 U/L (ref 0–35)
AST: 17 U/L (ref 0–37)
Albumin: 4.2 g/dL (ref 3.5–5.2)
Alkaline Phosphatase: 47 U/L (ref 39–117)
BUN: 14 mg/dL (ref 6–23)
CO2: 30 mEq/L (ref 19–32)
Calcium: 9.5 mg/dL (ref 8.4–10.5)
Chloride: 104 mEq/L (ref 96–112)
Creatinine, Ser: 0.67 mg/dL (ref 0.40–1.20)
GFR: 84.85 mL/min (ref >60.00)
Glucose, Bld: 111 mg/dL — ABNORMAL HIGH (ref 70–99)
Potassium: 4.6 mEq/L (ref 3.5–5.1)
Sodium: 140 mEq/L (ref 135–145)
Total Bilirubin: 0.6 mg/dL (ref 0.2–1.2)
Total Protein: 6.3 g/dL (ref 6.0–8.3)

## 2018-12-16 LAB — CBC WITH DIFFERENTIAL/PLATELET
Basophils Absolute: 0 10*3/uL (ref 0.0–0.1)
Basophils Relative: 0.8 % (ref 0.0–3.0)
Eosinophils Absolute: 0.2 10*3/uL (ref 0.0–0.7)
Eosinophils Relative: 4.6 % (ref 0.0–5.0)
HCT: 42.7 % (ref 36.0–46.0)
Hemoglobin: 14.3 g/dL (ref 12.0–15.0)
Lymphocytes Relative: 23.2 % (ref 12.0–46.0)
Lymphs Abs: 1.2 10*3/uL (ref 0.7–4.0)
MCHC: 33.4 g/dL (ref 30.0–36.0)
MCV: 97.6 fl (ref 78.0–100.0)
Monocytes Absolute: 0.4 10*3/uL (ref 0.1–1.0)
Monocytes Relative: 7.9 % (ref 3.0–12.0)
Neutro Abs: 3.3 10*3/uL (ref 1.4–7.7)
Neutrophils Relative %: 63.5 % (ref 43.0–77.0)
Platelets: 186 10*3/uL (ref 150.0–400.0)
RBC: 4.38 Mil/uL (ref 3.87–5.11)
RDW: 13.5 % (ref 11.5–15.5)
WBC: 5.1 10*3/uL (ref 4.0–10.5)

## 2018-12-16 LAB — LIPID PANEL
Cholesterol: 189 mg/dL (ref 0–200)
HDL: 62.6 mg/dL (ref >39.00)
LDL Cholesterol: 108 mg/dL — ABNORMAL HIGH (ref 0–99)
NonHDL: 126.28
Total CHOL/HDL Ratio: 3
Triglycerides: 91 mg/dL (ref 0.0–149.0)
VLDL: 18.2 mg/dL (ref 0.0–40.0)

## 2018-12-16 LAB — HEMOGLOBIN A1C: Hgb A1c MFr Bld: 6.5 % (ref 4.6–6.5)

## 2018-12-17 ENCOUNTER — Other Ambulatory Visit: Payer: Self-pay

## 2018-12-17 ENCOUNTER — Ambulatory Visit (INDEPENDENT_AMBULATORY_CARE_PROVIDER_SITE_OTHER): Payer: Medicare Other | Admitting: Family Medicine

## 2018-12-17 ENCOUNTER — Encounter: Payer: Self-pay | Admitting: Family Medicine

## 2018-12-17 VITALS — BP 146/82 | HR 51 | Temp 98.7°F | Ht 60.0 in | Wt 159.0 lb

## 2018-12-17 DIAGNOSIS — Z0000 Encounter for general adult medical examination without abnormal findings: Secondary | ICD-10-CM | POA: Diagnosis not present

## 2018-12-17 DIAGNOSIS — Z23 Encounter for immunization: Secondary | ICD-10-CM

## 2018-12-17 DIAGNOSIS — E7841 Elevated Lipoprotein(a): Secondary | ICD-10-CM | POA: Diagnosis not present

## 2018-12-17 DIAGNOSIS — E119 Type 2 diabetes mellitus without complications: Secondary | ICD-10-CM | POA: Diagnosis not present

## 2018-12-17 MED ORDER — ATORVASTATIN CALCIUM 10 MG PO TABS: 10.0000 mg | ORAL_TABLET | Freq: Every day | ORAL | 3 refills | Status: DC

## 2018-12-17 NOTE — Patient Instructions (Signed)
  Claire Drake , Thank you for taking time to come for your Medicare Wellness Visit. I appreciate your ongoing commitment to your health goals. Please review the following plan we discussed and let me know if I can assist you in the future.   These are the goals we discussed: Goals    . Continue to get regular exercise and make healthy food choices             This is a list of the screening recommended for you and due dates:  Health Maintenance  Topic Date Due  . Complete foot exam   09/11/1949  . Eye exam for diabetics  09/11/1949  . Urine Protein Check  09/11/1949  . Tetanus Vaccine  09/12/1958  . Pneumonia vaccines (2 of 2 - PPSV23) 07/01/2018  . Flu Shot  12/11/2018  . Hemoglobin A1C  06/18/2019  . DEXA scan (bone density measurement)  Completed

## 2018-12-17 NOTE — Progress Notes (Signed)
Subjective:   Claire Drake is a 78 y.o. female who presents for Medicare Annual (Subsequent) preventive examination. She has been doing well, participating in activities via [place].   Review of Systems:  Review of Systems  Constitutional: Negative.   HENT: Negative.   Eyes: Negative.   Respiratory: Negative.   Cardiovascular: Negative.   Gastrointestinal: Negative.   Genitourinary: Negative.   Musculoskeletal: Negative.   Skin: Negative.   Neurological: Negative.   Endo/Heme/Allergies: Negative.   Psychiatric/Behavioral: Negative.           Objective:    Physical Exam  Constitutional: Oriented to person, place, and time. He appears well-developed and well-nourished.  HENT:  Head: Normocephalic and atraumatic.  Eyes: Conjunctivae are normal.  Neck: Normal range of motion. Neck supple.  Cardiovascular: Normal rate, regular rhythm and normal heart sounds.   Pulmonary/Chest: Effort normal and breath sounds normal.  Musculoskeletal: Normal range of motion.  Neurological: Alert and oriented to person, place, and time.  Skin: Skin is warm and dry.  Psychiatric: Normal mood and affect. Behavior is normal. Judgment and thought content normal.  Vitals reviewed.    Vitals: BP (!) 146/82 (BP Location: Right Arm, Patient Position: Sitting, Cuff Size: Normal)   Pulse (!) 51   Temp 98.7 F (37.1 C) (Temporal)   Ht 5' (1.524 m)   Wt 159 lb (72.1 kg)   SpO2 97%   BMI 31.05 kg/m   Body mass index is 31.05 kg/m. Wt Readings from Last 3 Encounters:  12/17/18 159 lb (72.1 kg)  03/26/18 161 lb 12.8 oz (73.4 kg)  01/20/18 159 lb 14.4 oz (72.5 kg)    Advanced Directives 01/20/2018 10/14/2017 09/09/2017 09/01/2017 08/19/2017 07/29/2017  Does Patient Have a Medical Advance Directive? Yes Yes Yes Yes No Yes  Type of Paramedic of [place];Living will [place];Living will [place];Living will [place];Living  will - [place];Living will  Does patient want to make changes to medical advance directive? No - Patient declined - No - Patient declined No - Patient declined - -  Copy of [place] in Chart? Yes No - copy requested No - copy requested No - copy requested No - copy requested No - copy requested  Would patient like information on creating a medical advance directive? No - Patient declined - - No - Patient declined No - Patient declined -    Tobacco Social History   Tobacco Use  Smoking Status Never Smoker  Smokeless Tobacco Never Used     Counseling given: Not Answered   Clinical Intake:                       Past Medical History:  Diagnosis Date  . Arthritis   . Cancer ([place]) 08/2017   endometrial cancer; skin cancer on lip and one on head  . Dysrhythmia    seeing Dr. Rockey Situ 09/07/2017  . GERD (gastroesophageal reflux disease)    occasionally  . Hypertension 08/2017   just recently with new diagnosis  . Rectocele 2019  . Uncontrolled type 2 diabetes mellitus with hyperglycemia ([place]) 08/06/2017   A1C 8.7  07/2017   Past Surgical History:  Procedure Laterality Date  . ABDOMINAL HYSTERECTOMY    . APPENDECTOMY  1970  . CATARACT EXTRACTION, BILATERAL  05/12/2002  . CHOLECYSTECTOMY  1978  . LAPAROSCOPIC HYSTERECTOMY N/A 09/09/2017   Procedure: HYSTERECTOMY TOTAL LAPAROSCOPIC WITH REMOVAL  OF OVARIES AND TUBES, SENTINEL LYMPH NODE MAPPING AND BIOPSIES;  Surgeon: Mellody Drown, MD;  Location: [facility] ORS;  Service: Gynecology;  Laterality: N/A;  . TUBAL LIGATION Bilateral 1980   Family History  Problem Relation Age of Onset  . Dementia Mother   . Heart disease Father   . Heart attack Father   . Diabetes Maternal Grandmother   . Heart attack Maternal Grandfather   . Breast cancer Neg Hx    Social History   Socioeconomic History  . Marital status: Widowed    Spouse name: Not on file  . Number of children: Not on file  .  Years of education: Not on file  . Highest education level: Not on file  Occupational History  . Not on file  Social Needs  . Financial resource strain: Not on file  . Food insecurity    Worry: Not on file    Inability: Not on file  . Transportation needs    Medical: Not on file    Non-medical: Not on file  Tobacco Use  . Smoking status: Never Smoker  . Smokeless tobacco: Never Used  Substance and Sexual Activity  . Alcohol use: No    Frequency: Never  . Drug use: No  . Sexual activity: Never  Lifestyle  . Physical activity    Days per week: Not on file    Minutes per session: Not on file  . Stress: Not on file  Relationships  . Social Herbalist on phone: Not on file    Gets together: Not on file    Attends religious service: Not on file    Active member of club or organization: Not on file    Attends meetings of clubs or organizations: Not on file    Relationship status: Not on file  Other Topics Concern  . Not on file  Social History Narrative  . Not on file    Outpatient Encounter Medications as of 12/17/2018  Medication Sig  . alendronate (FOSAMAX) 70 MG tablet Take 1 tablet (70 mg total) by mouth every 7 (seven) days. Take with a full glass of water on an empty stomach.  Marland Kitchen aspirin EC 81 MG tablet Take 81 mg by mouth daily.  . calcium elemental as carbonate (TUMS ULTRA 1000) 400 MG chewable tablet Chew 1,000 mg by mouth daily as needed for heartburn.  . diphenhydrAMINE (BENADRYL) 25 mg capsule Take 25 mg by mouth at bedtime as needed for allergies or sleep.  Marland Kitchen ibuprofen (ADVIL,MOTRIN) 600 MG tablet Take 1 tablet (600 mg total) by mouth every 6 (six) hours.  . metFORMIN (GLUCOPHAGE) 500 MG tablet Take 1 tablet (500 mg total) by mouth daily with supper. Keep appointment in August for follow up  . naproxen sodium (ALEVE) 220 MG tablet Take 220-440 mg by mouth daily as needed (pain).  . Vitamin D, Ergocalciferol, (DRISDOL) 1.25 MG (50000 UT) CAPS capsule TAKE  1 CAPSULE EVERY 7 DAYS   No facility-administered encounter medications on file as of 12/17/2018.     Activities of Daily Living In your present state of health, do you have any difficulty performing the following activities: 12/17/2018  Hearing? N  Vision? N  Difficulty concentrating or making decisions? Y  Walking or climbing stairs? N  Dressing or bathing? N  Doing errands, shopping? N  Some recent data might be hidden    Patient Care Team: Elby Beck, FNP as PCP - General (Nurse Practitioner)    Assessment:  This is a routine wellness examination for [place].  Exercise Activities and Dietary recommendations    Goals    . Increase physical activity     Starting 07/29/2017, I will continue to walk for 20 minutes daily.       Fall Risk Fall Risk  12/17/2018 07/29/2017 07/01/2017  Falls in the past year? 0 Yes Yes  Number falls in past yr: - 2 or more 2 or more  Injury with Fall? - Yes -   Is the patient's home free of loose throw rugs in walkways, pet beds, electrical cords, etc?   yes      Grab bars in the bathroom? yes      Handrails on the stairs?   yes      Adequate lighting?   yes   Depression Screen PHQ 2/9 Scores 12/17/2018 07/29/2017 07/01/2017  PHQ - 2 Score 0 0 0  PHQ- 9 Score - 0 -     Cognitive Function MMSE - Mini Mental State Exam 07/29/2017  Orientation to time 5  Orientation to Place 5  Registration 3  Attention/ Calculation 0  Recall 3  Language- name 2 objects 0  Language- repeat 1  Language- follow 3 step command 3  Language- read & follow direction 0  Write a sentence 0  Copy design 0  Total score 20        Immunization History  Administered Date(s) Administered  . Influenza,inj,Quad PF,6+ Mos 02/09/2017  . Pneumococcal Conjugate-13 07/01/2017    Qualifies for Shingles Vaccine?yes, discussed getting at local pharmacy  Screening Tests Health Maintenance  Topic Date Due  . FOOT EXAM  09/11/1950  . OPHTHALMOLOGY EXAM   09[birth year]11/1950  . URINE MICROALBUMIN  09/11/1949  . TETANUS/TDAP  09/12/1958  . PNA vac Low Risk Adult (2 of 2 - PPSV23) 07/01/2018  . INFLUENZA VACCINE  12/11/2018  . HEMOGLOBIN A1C  06/18/2019  . DEXA SCAN  Completed    Cancer Screenings: Lung: Low Dose CT Chest recommended if Age 18-80 years, 30 pack-year currently smoking OR have quit w/in 15years. Patient does not qualify. Breast:  Up to date on Mammogram? Yes   Up to date of Bone Density/Dexa? Yes Colorectal: declines, previous screening colonoscopy ? 4-5 years ago  Additional Screenings:  Hepatitis C Screening: - will check at next lab draw     Plan:    1. Type 2 diabetes mellitus without complication, without long-term current use of insulin (HCC) -  Reviewed lipid panel results and patient agrees to start statin - atorvastatin (LIPITOR) 10 MG tablet; Take 1 tablet (10 mg total) by mouth daily.  Dispense: 90 tablet; Refill: 3  2. Elevated lipoprotein(a) - atorvastatin (LIPITOR) 10 MG tablet; Take 1 tablet (10 mg total) by mouth daily.  Dispense: 90 tablet; Refill: 3  3. Need for vaccination against Streptococcus pneumoniae - Pneumococcal polysaccharide vaccine 23-valent greater than or equal to 2yo subcutaneous/IM  4. Medicare annual wellness visit, subsequent  - follow up in 6 moths   I have personally reviewed and noted the following in the patient's chart:   . Medical and social history . Use of alcohol, tobacco or illicit drugs  . Current medications and supplements . Functional ability and status . Nutritional status . Physical activity . Advanced directives . List of other physicians . Hospitalizations, surgeries, and ER visits in previous 12 months . Vitals . Screenings to include cognitive, depression, and falls . Referrals and appointments  In addition, I have reviewed and discussed  with patient certain preventive protocols, quality metrics, and best practice recommendations. A written personalized care  plan for preventive services as well as general preventive health recommendations were provided to patient.     Elby Beck, FNP  12/17/2018

## 2018-12-21 ENCOUNTER — Encounter: Payer: Self-pay | Admitting: Family Medicine

## 2019-01-19 ENCOUNTER — Ambulatory Visit: Payer: Medicare Other

## 2019-02-01 ENCOUNTER — Other Ambulatory Visit: Payer: Self-pay | Admitting: Family Medicine

## 2019-02-01 DIAGNOSIS — M810 Age-related osteoporosis without current pathological fracture: Secondary | ICD-10-CM

## 2019-02-15 ENCOUNTER — Other Ambulatory Visit: Payer: Self-pay

## 2019-02-15 NOTE — Progress Notes (Signed)
Patient pre screened for office appointment, no questions or concerns today. 

## 2019-02-16 ENCOUNTER — Encounter (INDEPENDENT_AMBULATORY_CARE_PROVIDER_SITE_OTHER): Payer: Self-pay

## 2019-02-16 ENCOUNTER — Encounter: Payer: Self-pay | Admitting: Obstetrics and Gynecology

## 2019-02-16 ENCOUNTER — Inpatient Hospital Stay: Payer: Medicare Other | Attending: Obstetrics and Gynecology | Admitting: Obstetrics and Gynecology

## 2019-02-16 ENCOUNTER — Other Ambulatory Visit: Payer: Self-pay

## 2019-02-16 VITALS — BP 192/62 | HR 68 | Temp 96.7°F | Wt 157.0 lb

## 2019-02-16 DIAGNOSIS — Z7982 Long term (current) use of aspirin: Secondary | ICD-10-CM | POA: Diagnosis not present

## 2019-02-16 DIAGNOSIS — E1165 Type 2 diabetes mellitus with hyperglycemia: Secondary | ICD-10-CM | POA: Diagnosis not present

## 2019-02-16 DIAGNOSIS — Z7984 Long term (current) use of oral hypoglycemic drugs: Secondary | ICD-10-CM | POA: Diagnosis not present

## 2019-02-16 DIAGNOSIS — I499 Cardiac arrhythmia, unspecified: Secondary | ICD-10-CM | POA: Diagnosis not present

## 2019-02-16 DIAGNOSIS — J301 Allergic rhinitis due to pollen: Secondary | ICD-10-CM | POA: Insufficient documentation

## 2019-02-16 DIAGNOSIS — I10 Essential (primary) hypertension: Secondary | ICD-10-CM | POA: Insufficient documentation

## 2019-02-16 DIAGNOSIS — I4891 Unspecified atrial fibrillation: Secondary | ICD-10-CM | POA: Diagnosis not present

## 2019-02-16 DIAGNOSIS — E559 Vitamin D deficiency, unspecified: Secondary | ICD-10-CM | POA: Diagnosis not present

## 2019-02-16 DIAGNOSIS — Z90710 Acquired absence of both cervix and uterus: Secondary | ICD-10-CM | POA: Insufficient documentation

## 2019-02-16 DIAGNOSIS — Z90722 Acquired absence of ovaries, bilateral: Secondary | ICD-10-CM | POA: Diagnosis not present

## 2019-02-16 DIAGNOSIS — K219 Gastro-esophageal reflux disease without esophagitis: Secondary | ICD-10-CM | POA: Diagnosis not present

## 2019-02-16 DIAGNOSIS — M810 Age-related osteoporosis without current pathological fracture: Secondary | ICD-10-CM | POA: Insufficient documentation

## 2019-02-16 DIAGNOSIS — C541 Malignant neoplasm of endometrium: Secondary | ICD-10-CM | POA: Insufficient documentation

## 2019-02-16 DIAGNOSIS — N816 Rectocele: Secondary | ICD-10-CM | POA: Diagnosis not present

## 2019-02-16 NOTE — Patient Instructions (Signed)
Please call and make appointment to see Dr. Ilda Basset in February 2021

## 2019-02-16 NOTE — Progress Notes (Signed)
Gynecologic Oncology Interval Visit   Referring Provider: Dr. Ilda Basset  Chief Complaint: Endometroid adenocarcinoma, grade 1, surveillance  Subjective:  Claire Drake is a 78 y.o. [id number], post-menopausal, female with stage 1, grade 1 endometrial cancer seen in consultation from Claire Reamer, Claire Drake and Dr. Ilda Basset ([facility] at [facility]).   She underwent TLH, BSO, SLN mapping and biopsies on 09/09/2017. She returns to clinic today for continued surveillance.    We previously discussed rotating visits with Dr. Ilda Basset. She has not yet seen him. She was last seen by Dr. Fransisca Connors on 01/20/2018.   Only complaint today is hay fever.   Gynecologic Oncology History  Claire Drake is a [id number], post-menopausal, female with who presented with grade 1 endometrial cancer seen in consultation from Claire Drake, [place] and Dr. Ilda Basset [facility] for women[facility] care at [facility]).Please see prior notes for complete details.   She underwent TLH, BSO, SLN mapping and biopsies on 09/09/2017.   Pathology  DIAGNOSIS:  A. UTERUS WITH CERVIX; HYSTERECTOMY:  - ENDOMETRIOID ADENOCARCINOMA WITH MUCINOUS FEATURES, FIGO GRADE 1.  - NEGATIVE FOR MYOMETRIAL INVASION, CERVICAL INVOLVEMENT, AND SEROSAL  INVOLVEMENT.  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  - INCIDENTAL 3 MM BENIGN INTRAMURAL MESENCHYMAL NODULE; ENDOMETRIAL  STROMAL NODULE VERSUS CELLULAR LEIOMYOMA.   BILATERAL FALLOPIAN TUBES AND OVARIES; SALPINGO-OOPHORECTOMY:  - NEGATIVE FOR MALIGNANCY.  - ATROPHY.   B. SENTINEL LYMPH NODE, LEFT EXTERNAL ILIAC; EXCISION:  - NEGATIVE FOR MALIGNANCY, ONE LYMPH NODE (0/1).   C. SENTINEL LYMPH NODE, RIGHT EXTERNAL ILIAC #1; EXCISION:  - NEGATIVE FOR MALIGNANCY, ONE LYMPH NODE (0/1).   D. SENTINEL LYMPH NODE, RIGHT EXTERNAL ILIAC #2; EXCISION:  - NEGATIVE FOR MALIGNANCY, TWO LYMPH NODES (0/2).   E. LYMPH NODE, LEFT PELVIC; EXCISION:  - NEGATIVE FOR MALIGNANCY, ONE  LYMPH NODE (0/1).      MLH1: Intact nuclear expression  MSH2: Intact nuclear expression  MSH6: Intact nuclear expression  PMS2: Intact nuclear expression   Cytology negative     Problem List: Patient Active Problem List   Diagnosis Date Noted  . Age-related osteoporosis without current pathological fracture 03/26/2018  . Vitamin D deficiency 03/26/2018  . Irregular heart rhythm 08/28/2017  . Uterine cancer ([place]) 08/18/2017  . Vaginal discharge 08/13/2017  . Transient hypertension 08/13/2017  . Uncontrolled type 2 diabetes mellitus with hyperglycemia ([place]) 08/06/2017  . BCC (basal cell carcinoma), face 08/02/2013    Past Medical History: Past Medical History:  Diagnosis Date  . Arthritis   . Cancer ([place]) 08/2017   endometrial cancer; skin cancer on lip and one on head  . Dysrhythmia    seeing Dr. Rockey Situ 09/07/2017  . GERD (gastroesophageal reflux disease)    occasionally  . Hypertension 08/2017   just recently with new diagnosis  . Rectocele 2019  . Uncontrolled type 2 diabetes mellitus with hyperglycemia ([place]) 08/06/2017   A1C 8.7  07/2017    Past Surgical History: Past Surgical History:  Procedure Laterality Date  . ABDOMINAL HYSTERECTOMY    . APPENDECTOMY  1970  . CATARACT EXTRACTION, BILATERAL  05/12/2002  . CHOLECYSTECTOMY  1978  . LAPAROSCOPIC HYSTERECTOMY N/A 09/09/2017   Procedure: HYSTERECTOMY TOTAL LAPAROSCOPIC WITH REMOVAL OF OVARIES AND TUBES, SENTINEL LYMPH NODE MAPPING AND BIOPSIES;  Surgeon: Mellody Drown, MD;  Location: [facility] ORS;  Service: Gynecology;  Laterality: N/A;  . TUBAL LIGATION Bilateral 1980   Past Gynecologic History:  G3P2. Denies abnormal pap smears. Tubal ligation in 1980s.  Not currently sexually active. Denies history of STIs. Denies HRT use.   OB History:  OB History  Gravida Para Term Preterm AB Living  _0 SAB TAB Ectopic Multiple Live Births  1       2    # Outcome Date GA Lbr Len/2nd Weight Sex Delivery Anes PTL  Lv  3 SAB           2 Term           1 Term             Obstetric Comments  svd x 2. sab x 1    Family History: Family History  Problem Relation Age of Onset  . Dementia Mother   . Heart disease Father   . Heart attack Father   . Diabetes Maternal Grandmother   . Heart attack Maternal Grandfather   . Breast cancer Neg Hx     Social History: Social History   Socioeconomic History  . Marital status: Widowed    Spouse name: Not on file  . Number of children: Not on file  . Years of education: Not on file  . Highest education level: Not on file  Occupational History  . Not on file  Social Needs  . Financial resource strain: Not on file  . Food insecurity    Worry: Not on file    Inability: Not on file  . Transportation needs    Medical: Not on file    Non-medical: Not on file  Tobacco Use  . Smoking status: Never Smoker  . Smokeless tobacco: Never Used  Substance and Sexual Activity  . Alcohol use: No    Frequency: Never  . Drug use: No  . Sexual activity: Never  Lifestyle  . Physical activity    Days per week: Not on file    Minutes per session: Not on file  . Stress: Not on file  Relationships  . Social Herbalist on phone: Not on file    Gets together: Not on file    Attends religious service: Not on file    Active member of club or organization: Not on file    Attends meetings of clubs or organizations: Not on file    Relationship status: Not on file  . Intimate partner violence    Fear of current or ex partner: Not on file    Emotionally abused: Not on file    Physically abused: Not on file    Forced sexual activity: Not on file  Other Topics Concern  . Not on file  Social History Narrative  . Not on file    Allergies: Allergies  Allergen Reactions  . Seasonal Ic [Cholestatin] Other (See Comments)    Stuffy, nasal drainage    Current Medications: Current Outpatient Medications  Medication Sig Dispense Refill  . alendronate  (FOSAMAX) 70 MG tablet TAKE 1 TABLET BY MOUTH EVERY 7 DAYS. TAKE WITH FULL GLASS OF WATER ONEMPTY STOMACH 12 tablet 3  . aspirin EC 81 MG tablet Take 81 mg by mouth daily.    Marland Kitchen atorvastatin (LIPITOR) 10 MG tablet Take 1 tablet (10 mg total) by mouth daily. 90 tablet 3  . calcium elemental as carbonate (TUMS ULTRA 1000) 400 MG chewable tablet Chew 1,000 mg by mouth daily as needed for heartburn.    . diphenhydrAMINE (BENADRYL) 25 mg capsule Take 25 mg by mouth at bedtime as needed for allergies or  sleep.    . ibuprofen (ADVIL,MOTRIN) 600 MG tablet Take 1 tablet (600 mg total) by mouth every 6 (six) hours. 65 tablet 1  . metFORMIN (GLUCOPHAGE) 500 MG tablet Take 1 tablet (500 mg total) by mouth daily with supper. Keep appointment in August for follow up 90 tablet 0  . naproxen sodium (ALEVE) 220 MG tablet Take 220-440 mg by mouth daily as needed (pain).    . Vitamin D, Ergocalciferol, (DRISDOL) 1.25 MG (50000 UT) CAPS capsule TAKE 1 CAPSULE EVERY 7 DAYS 12 capsule 3   No current facility-administered medications for this visit.    Review of Systems General:  no complaints Skin: no complaints Eyes: no complaints HEENT: no complaints Breasts: no complaints Pulmonary: no complaints Cardiac: no complaints Gastrointestinal: no complaints Genitourinary/Sexual: no complaints Ob/Gyn: no complaints Musculoskeletal: no complaints Hematology: no complaints Neurologic/Psych: no complaints    General: no complaints  HEENT: no complaints  Lungs: no complaints  Cardiac: no complaints  GI: abdominal bloating/distention  GU: no complaints  Musculoskeletal: no complaints  Extremities: no complaints  Skin: no complaints  Neuro: no complaints  Endocrine: no complaints  Psych: no complaints     Objective:  Physical Examination:  There were no vitals taken for this visit.    ECOG Performance Status: 0 - Asymptomatic   GENERAL: Patient is a well appearing female in no acute distress HEENT:   Sclera clear. Anicteric NODES:  Negative axillary, supraclavicular, inguinal lymph node survery LUNGS:  Clear to auscultation bilaterally.   HEART:  Regular rate and rhythm.  ABDOMEN:  Soft, nontender.  No hernias, incisions well healed. No masses or ascites EXTREMITIES:  No peripheral edema. Atraumatic. No cyanosis SKIN:  Clear with no obvious rashes or skin changes.  NEURO:  Nonfocal. Well oriented.  Appropriate affect.  Pelvic: Exam chaperoned by RN: EGBUS/Vagina: large rectocoele, Cervix/Uterus surgically absent. Bimanual no masses, vaginal induration present c/w postop. RV confirms.    Assessment:  LEAUNA SHARBER is a 78 y.o. female diagnosed with stage Ia, grade 1 endometrioid endometrial cancer with no myometrial invasion. NED.  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 NED  Medical co-morbidities complicating care: Atrial fibrillation, T2DM   Plan:   Problem List Items Addressed This Visit    None    Visit Diagnoses    Endometrial cancer ([place])    -  Primary     Risk of recurrence is <= 5%. No adjuvant therapy prescribed.  She will alternate follow up with Dr Ilda Basset q 6 months.  She will see him in 6 months and [place] in a year.  Can return sooner if any concerning symptoms arise.    Mellody Drown MD  CC:  Referring Provider: Dr. Ilda Basset

## 2019-02-21 NOTE — Progress Notes (Signed)
Noticed that bp significantly elevated at recent visit. Ask her if she is able to check at home? If so, have her check it twice a week for 2 weeks and schedule her a follow up in the office. Have her bring readings if she is able to get them. Ask if she is having headache, dizziness, weakness or visual changes. If having any symptoms, schedule her for sooner appointment.

## 2019-02-24 NOTE — Progress Notes (Signed)
Spoke with patient, states that she does have means of checking her BP at home. Pt states that she is not symptomatic - denies HA, dizziness or visual changes. Pt states that she has felt fine since her visit and had not "felt" any high BP episodes. Pt states that she had a rough morning the morning of her appt 02/16/2019 which she feels is the reason her BP was so elevated. Pt states that even then she as asymptomatic and had no idea her BP was up.   Will send to [facility] as FYI.

## 2019-02-25 NOTE — Progress Notes (Signed)
Please call her and schedule her for an office visit in the next couple of weeks to recheck her blood pressure.

## 2019-03-07 ENCOUNTER — Other Ambulatory Visit: Payer: Self-pay | Admitting: Family Medicine

## 2019-03-07 DIAGNOSIS — E1165 Type 2 diabetes mellitus with hyperglycemia: Secondary | ICD-10-CM

## 2019-03-07 NOTE — Telephone Encounter (Signed)
error 

## 2019-03-09 NOTE — Progress Notes (Signed)
Pt scheduled for Friday Nov 13th at [facility] for BP follow up -- pt Is going to purchase a BP monitor and will check her BP atleast 1-2 x a day until her appt. Pt explained how to keep a log. I also told patient that if she needed to come by to have me show her how to use the BP monitor she can come by anytime and I will show her. Pt states that she will call if she needs additional help.   Nothing further needed.

## 2019-03-25 ENCOUNTER — Encounter: Payer: Self-pay | Admitting: Family Medicine

## 2019-03-25 ENCOUNTER — Other Ambulatory Visit: Payer: Self-pay

## 2019-03-25 ENCOUNTER — Ambulatory Visit (INDEPENDENT_AMBULATORY_CARE_PROVIDER_SITE_OTHER): Payer: Medicare Other | Admitting: Family Medicine

## 2019-03-25 VITALS — BP 152/96 | HR 81 | Temp 97.4°F | Ht 60.0 in | Wt 152.1 lb

## 2019-03-25 DIAGNOSIS — I10 Essential (primary) hypertension: Secondary | ICD-10-CM

## 2019-03-25 MED ORDER — LISINOPRIL 10 MG PO TABS: 10.0000 mg | ORAL_TABLET | Freq: Every day | ORAL | 2 refills | Status: DC

## 2019-03-25 NOTE — Patient Instructions (Signed)
Good to see you today  Check your blood pressure 2 times a week  Follow up in 4 weeks for check and blood work, bring your log

## 2019-03-25 NOTE — Progress Notes (Signed)
Subjective:    Patient ID: Claire Drake, female    DOB: 1940/07/29, 78 y.o.   MRN: [id number]  HPI Chief Complaint  Patient presents with  . Hypertension    Pt here today for BP check up. PT states that her BP ranges have not been good since last OV. She brought a log with her. Ranges 150s-190s/80s-100s. Denies ever having headaches or dizziness.     This is a 78 yo female who presents today for follow up of elevated blood  pressure. Was elevated at oncologist's office a couple of weeks ago. She bought a home cuff and has been taking her readings. See range above. She denies chest pain, headache, visual changes.  Has been watching diet.   Past Medical History:  Diagnosis Date  . Arthritis   . Cancer ([place]) 08/2017   endometrial cancer; skin cancer on lip and one on head  . Dysrhythmia    seeing Dr. Rockey Situ 09/07/2017  . GERD (gastroesophageal reflux disease)    occasionally  . Hypertension 08/2017   just recently with new diagnosis  . Rectocele 2019  . Uncontrolled type 2 diabetes mellitus with hyperglycemia ([place]) 08/06/2017   A1C 8.7  07/2017   Past Surgical History:  Procedure Laterality Date  . ABDOMINAL HYSTERECTOMY    . APPENDECTOMY  1970  . CATARACT EXTRACTION, BILATERAL  05/12/2002  . CHOLECYSTECTOMY  1978  . LAPAROSCOPIC HYSTERECTOMY N/A 09/09/2017   Procedure: HYSTERECTOMY TOTAL LAPAROSCOPIC WITH REMOVAL OF OVARIES AND TUBES, SENTINEL LYMPH NODE MAPPING AND BIOPSIES;  Surgeon: Mellody Drown, MD;  Location: [facility] ORS;  Service: Gynecology;  Laterality: N/A;  . TUBAL LIGATION Bilateral 1980   Family History  Problem Relation Age of Onset  . Dementia Mother   . Heart disease Father   . Heart attack Father   . Diabetes Maternal Grandmother   . Heart attack Maternal Grandfather   . Breast cancer Neg Hx    Social History   Tobacco Use  . Smoking status: Never Smoker  . Smokeless tobacco: Never Used  Substance Use Topics  . Alcohol use: No    Frequency: Never  .  Drug use: No    Review of Systems Per HPI    Objective:   Physical Exam Vitals signs reviewed.  Constitutional:      General: She is not in acute distress.    Appearance: Normal appearance. She is normal weight. She is not ill-appearing, toxic-appearing or diaphoretic.  HENT:     Head: Normocephalic and atraumatic.  Cardiovascular:     Rate and Rhythm: Normal rate.  Pulmonary:     Effort: Pulmonary effort is normal.  Musculoskeletal:     Right lower leg: No edema.     Left lower leg: No edema.  Neurological:     Mental Status: She is alert and oriented to person, place, and time.       BP (!) 152/96 (BP Location: Left Arm, Patient Position: Sitting, Cuff Size: Normal)   Pulse 81   Temp (!) 97.4 F (36.3 C) (Temporal)   Ht 5' (1.524 m)   Wt 152 lb 1.9 oz (69 kg)   SpO2 100%   BMI 29.71 kg/m  Wt Readings from Last 3 Encounters:  03/25/19 152 lb 1.9 oz (69 kg)  02/16/19 157 lb (71.2 kg)  12/17/18 159 lb (72.1 kg)       Assessment & Plan:  1. Essential hypertension - discussed readings and will start lisinopril, discussed possible side effects -  follow up in 4 weeks for bp check and bmet - asked her to check her home BP 2x/ week and bring log to visit - lisinopril (ZESTRIL) 10 MG tablet; Take 1 tablet (10 mg total) by mouth daily.  Dispense: 30 tablet; Refill: 2   Clarene Reamer, FNP-BC  [facility] at [facility], [place] [facility]  03/25/2019 9:17 AM

## 2019-04-25 ENCOUNTER — Encounter: Payer: Self-pay | Admitting: Family Medicine

## 2019-04-25 ENCOUNTER — Ambulatory Visit (INDEPENDENT_AMBULATORY_CARE_PROVIDER_SITE_OTHER): Payer: Medicare Other | Admitting: Family Medicine

## 2019-04-25 ENCOUNTER — Other Ambulatory Visit: Payer: Self-pay

## 2019-04-25 VITALS — BP 182/80 | HR 74 | Temp 97.6°F | Ht 60.0 in | Wt 153.5 lb

## 2019-04-25 DIAGNOSIS — I10 Essential (primary) hypertension: Secondary | ICD-10-CM | POA: Diagnosis not present

## 2019-04-25 LAB — BASIC METABOLIC PANEL
BUN: 16 mg/dL (ref 6–23)
CO2: 26 mEq/L (ref 19–32)
Calcium: 9.2 mg/dL (ref 8.4–10.5)
Chloride: 105 mEq/L (ref 96–112)
Creatinine, Ser: 0.68 mg/dL (ref 0.40–1.20)
GFR: 83.33 mL/min (ref >60.00)
Glucose, Bld: 111 mg/dL — ABNORMAL HIGH (ref 70–99)
Potassium: 4.2 mEq/L (ref 3.5–5.1)
Sodium: 139 mEq/L (ref 135–145)

## 2019-04-25 MED ORDER — LISINOPRIL 20 MG PO TABS: 20.0000 mg | ORAL_TABLET | Freq: Every day | ORAL | 1 refills | Status: DC

## 2019-04-25 NOTE — Patient Instructions (Addendum)
Good to see you today, I have sent in a higher dose of the lisinopril.   Please check your blood pressure twice a week. In 4 weeks, call the office and give them your readings.   Goal is less than 140/90

## 2019-04-25 NOTE — Progress Notes (Addendum)
Subjective:    Patient ID: Claire Drake, female    DOB: [birth year], 78 y.o.   MRN: [id number]  HPI This is a 78 yo female who presents today for follow up of HTN.  She was seen 1 month ago and started on lisinopril 10 mg.  She has been tolerating well.  She reports occasional acid indigestion but not daily.  This is relieved with Tums.  She has been checking her blood pressure twice a week and brings in readings.  Blood pressure has been 139-180/85-104.  She has been feeling well.  Past Medical History:  Diagnosis Date  . Arthritis   . Cancer ([place]) 08/2017   endometrial cancer; skin cancer on lip and one on head  . Dysrhythmia    seeing Dr. Rockey Situ 09/07/2017  . GERD (gastroesophageal reflux disease)    occasionally  . Hypertension 08/2017   just recently with new diagnosis  . Rectocele 2019  . Uncontrolled type 2 diabetes mellitus with hyperglycemia ([place]) 08/06/2017   A1C 8.7  07/2017   Past Surgical History:  Procedure Laterality Date  . ABDOMINAL HYSTERECTOMY    . APPENDECTOMY  1970  . CATARACT EXTRACTION, BILATERAL  05/12/2002  . CHOLECYSTECTOMY  1978  . LAPAROSCOPIC HYSTERECTOMY N/A 09/09/2017   Procedure: HYSTERECTOMY TOTAL LAPAROSCOPIC WITH REMOVAL OF OVARIES AND TUBES, SENTINEL LYMPH NODE MAPPING AND BIOPSIES;  Surgeon: Mellody Drown, MD;  Location: [facility] ORS;  Service: Gynecology;  Laterality: N/A;  . TUBAL LIGATION Bilateral 1980   Family History  Problem Relation Age of Onset  . Dementia Mother   . Heart disease Father   . Heart attack Father   . Diabetes Maternal Grandmother   . Heart attack Maternal Grandfather   . Breast cancer Neg Hx    Social History   Tobacco Use  . Smoking status: Never Smoker  . Smokeless tobacco: Never Used  Substance Use Topics  . Alcohol use: No  . Drug use: No      Review of Systems Per HPI    Objective:   Physical Exam Vitals reviewed.  Constitutional:      General: She is not in acute distress.    Appearance: Normal  appearance. She is not ill-appearing, toxic-appearing or diaphoretic.  HENT:     Head: Normocephalic and atraumatic.     Right Ear: External ear normal.     Left Ear: External ear normal.  Eyes:     Conjunctiva/sclera: Conjunctivae normal.  Cardiovascular:     Rate and Rhythm: Normal rate.  Pulmonary:     Effort: Pulmonary effort is normal.  Neurological:     Mental Status: She is alert and oriented to person, place, and time.  Psychiatric:        Mood and Affect: Mood normal.        Behavior: Behavior normal.        Thought Content: Thought content normal.        Judgment: Judgment normal.          BP (!) 182/80 (BP Location: Left Arm, Patient Position: Sitting, Cuff Size: Normal)   Pulse 74   Temp 97.6 F (36.4 C) (Temporal)   Ht 5' (1.524 m)   Wt 153 lb 8 oz (69.6 kg)   SpO2 98%   BMI 29.98 kg/m  Wt Readings from Last 3 Encounters:  04/25/19 153 lb 8 oz (69.6 kg)  03/25/19 152 lb 1.9 oz (69 kg)  02/16/19 157 lb (71.2 kg)   BP Readings from  Last 3 Encounters:  04/25/19 (!) 182/80  03/25/19 (!) 152/96  02/16/19 (!) 192/62    Assessment & Plan:  1. Essential hypertension -Blood pressure not at goal.  Discussed with patient.  Will increase her lisinopril to 20 mg.  She will continue to take biweekly blood pressure readings and she will call the office in 4 weeks to report her readings.  If she has any side effects or difficulties with medication she is to let me know immediately. - Basic Metabolic Panel - lisinopril (ZESTRIL) 20 MG tablet; Take 1 tablet (20 mg total) by mouth daily.  Dispense: 90 tablet; Refill: 1  This visit occurred during the SARS-CoV-2 public health emergency.  Safety protocols were in place, including screening questions prior to the visit, additional usage of staff PPE, and extensive cleaning of exam room while observing appropriate contact time as indicated for disinfecting solutions.    Clarene Reamer, FNP-BC  [facility] at  [facility], [place] [facility]  04/25/2019 8:27 AM

## 2019-05-16 ENCOUNTER — Ambulatory Visit (INDEPENDENT_AMBULATORY_CARE_PROVIDER_SITE_OTHER): Payer: Medicare PPO | Admitting: Family Medicine

## 2019-05-16 ENCOUNTER — Other Ambulatory Visit: Payer: Self-pay

## 2019-05-16 ENCOUNTER — Encounter: Payer: Self-pay | Admitting: Family Medicine

## 2019-05-16 VITALS — BP 144/82 | HR 82 | Temp 97.5°F | Ht 60.0 in | Wt 157.4 lb

## 2019-05-16 DIAGNOSIS — I10 Essential (primary) hypertension: Secondary | ICD-10-CM | POA: Diagnosis not present

## 2019-05-16 DIAGNOSIS — E119 Type 2 diabetes mellitus without complications: Secondary | ICD-10-CM

## 2019-05-16 DIAGNOSIS — H00015 Hordeolum externum left lower eyelid: Secondary | ICD-10-CM | POA: Diagnosis not present

## 2019-05-16 NOTE — Progress Notes (Signed)
Subjective:    Patient ID: Claire Drake, female    DOB: 02-13-1941, 79 y.o.   MRN: [id number]  HPI Chief Complaint  Patient presents with  . Stye    left eye - x 2, itching. No pain.    This is a 79 yo female who presents today with above cc. She awoke this morning with stye on lower left eyelid. Mild itching, no pain, no drainage. A little matting this morning. No visual changes. Had stye on same eyelid last month, took weeks to get smaller, has not resolved completely. History of DM, no eye exam in several years.   HTN- brings home readings. Uses upper arm cuff, readings 166-195/99-112. Reading from this am, after taking meds was 195/112. Here in office was BP: (!) 144/82 She denies headache, visual changes, chest pain, SOB, leg swelling.      Past Medical History:  Diagnosis Date  . Arthritis   . Cancer ([place]) 08/2017   endometrial cancer; skin cancer on lip and one on head  . Dysrhythmia    seeing Dr. Rockey Situ 09/07/2017  . GERD (gastroesophageal reflux disease)    occasionally  . Hypertension 08/2017   just recently with new diagnosis  . Rectocele 2019  . Uncontrolled type 2 diabetes mellitus with hyperglycemia ([place]) 08/06/2017   A1C 8.7  07/2017   Past Surgical History:  Procedure Laterality Date  . ABDOMINAL HYSTERECTOMY    . APPENDECTOMY  1970  . CATARACT EXTRACTION, BILATERAL  05/12/2002  . CHOLECYSTECTOMY  1978  . LAPAROSCOPIC HYSTERECTOMY N/A 09/09/2017   Procedure: HYSTERECTOMY TOTAL LAPAROSCOPIC WITH REMOVAL OF OVARIES AND TUBES, SENTINEL LYMPH NODE MAPPING AND BIOPSIES;  Surgeon: Mellody Drown, MD;  Location: [facility] ORS;  Service: Gynecology;  Laterality: N/A;  . TUBAL LIGATION Bilateral 1980   Family History  Problem Relation Age of Onset  . Dementia Mother   . Heart disease Father   . Heart attack Father   . Diabetes Maternal Grandmother   . Heart attack Maternal Grandfather   . Breast cancer Neg Hx    Social History   Tobacco Use  . Smoking status:  Never Smoker  . Smokeless tobacco: Never Used  Substance Use Topics  . Alcohol use: No  . Drug use: No      Review of Systems     Objective:   Physical Exam Vitals reviewed.  Constitutional:      General: She is not in acute distress.    Appearance: Normal appearance. She is obese. She is not ill-appearing, toxic-appearing or diaphoretic.  HENT:     Head: Normocephalic and atraumatic.  Eyes:     General:        Right eye: No discharge or hordeolum.        Left eye: Hordeolum (lower, outer 1/3) present.No discharge.     Extraocular Movements: Extraocular movements intact.     Conjunctiva/sclera: Conjunctivae normal.  Cardiovascular:     Rate and Rhythm: Normal rate.  Pulmonary:     Effort: Pulmonary effort is normal.  Neurological:     Mental Status: She is alert and oriented to person, place, and time.  Psychiatric:        Mood and Affect: Mood normal.        Behavior: Behavior normal.        Thought Content: Thought content normal.        Judgment: Judgment normal.       BP (!) 144/82 (BP Location: Left Arm, Patient Position:  Sitting, Cuff Size: Normal)   Pulse 82   Temp (!) 97.5 F (36.4 C) (Temporal)   Ht 5' (1.524 m)   Wt 157 lb 6.4 oz (71.4 kg)   SpO2 95%   BMI 30.74 kg/m  Wt Readings from Last 3 Encounters:  05/16/19 157 lb 6.4 oz (71.4 kg)  04/25/19 153 lb 8 oz (69.6 kg)  03/25/19 152 lb 1.9 oz (69 kg)   BP Readings from Last 3 Encounters:  05/16/19 (!) 144/82  04/25/19 (!) 182/80  03/25/19 (!) 152/96       Assessment & Plan:  1. Hordeolum externum of left lower eyelid - reviewed possible causes, discussed follow up with opthalmology if not improved in 1-2 weeks. Recommended gentle cleansing with baby shampoo daily, warm compresses - follow up precautions reviewed  2. Essential hypertension - BP improved in office, not sure if home cuff is accurate, she has a follow up appointment in a couple of weeks, she will bring her log and home cuff to  compare for accuracy  3. Type 2 diabetes mellitus without complication, without long-term current use of insulin ([place]) - discussed importance of annual eye exam and encouraged her to schedule yearly exam  This visit occurred during the SARS-CoV-2 public health emergency.  Safety protocols were in place, including screening questions prior to the visit, additional usage of staff PPE, and extensive cleaning of exam room while observing appropriate contact time as indicated for disinfecting solutions.    Clarene Reamer, FNP-BC  [facility] at [facility], [place] [facility]  05/16/2019 10:51 AM

## 2019-05-16 NOTE — Patient Instructions (Signed)
Use baby shampoo to gently wash eyelids daily Warm compresses 3-5 times a day as needed Call [place] for eye exam  Stye  A stye, also known as a hordeolum, is a bump that forms on an eyelid. It may look like a pimple next to the eyelash. A stye can form inside the eyelid (internal stye) or outside the eyelid (external stye). A stye can cause redness, swelling, and pain on the eyelid. Styes are very common. Anyone can get them at any age. They usually occur in just one eye, but you may have more than one in either eye. What are the causes? A stye is caused by an infection. The infection is almost always caused by bacteria called Staphylococcus aureus. This is a common type of bacteria that lives on the skin. An internal stye may result from an infected oil-producing gland inside the eyelid. An external stye may be caused by an infection at the base of the eyelash (hair follicle). What increases the risk? You are more likely to develop a stye if:  You have had a stye before.  You have any of these conditions: ? Diabetes. ? Red, itchy, inflamed eyelids (blepharitis). ? A skin condition such as seborrheic dermatitis or rosacea. ? High fat levels in your blood (lipids). What are the signs or symptoms? The most common symptom of a stye is eyelid pain. Internal styes are more painful than external styes. Other symptoms may include:  Painful swelling of your eyelid.  A scratchy feeling in your eye.  Tearing and redness of your eye.  Pus draining from the stye. How is this diagnosed? Your health care provider may be able to diagnose a stye just by examining your eye. The health care provider may also check to make sure:  You do not have a fever or other signs of a more serious infection.  The infection has not spread to other parts of your eye or areas around your eye. How is this treated? Most styes will clear up in a few days without treatment or with warm compresses applied to  the area. You may need to use antibiotic drops or ointment to treat an infection. In some cases, if your stye does not heal with routine treatment, your health care provider may drain pus from the stye using a thin blade or needle. This may be done if the stye is large, causing a lot of pain, or affecting your vision. Follow these instructions at home:  Take over-the-counter and prescription medicines only as told by your health care provider. This includes eye drops or ointments.  If you were prescribed an antibiotic medicine, apply or use it as told by your health care provider. Do not stop using the antibiotic even if your condition improves.  Apply a warm, wet cloth (warm compress) to your eye for 5-10 minutes, 4 times a day.  Clean the affected eyelid as directed by your health care provider.  Do not wear contact lenses or eye makeup until your stye has healed.  Do not try to pop or drain the stye.  Do not rub your eye. Contact a health care provider if:  You have chills or a fever.  Your stye does not go away after several days.  Your stye affects your vision.  Your eyeball becomes swollen, red, or painful. Get help right away if:  You have pain when moving your eye around. Summary  A stye is a bump that forms on an eyelid. It may  look like a pimple next to the eyelash.  A stye can form inside the eyelid (internal stye) or outside the eyelid (external stye). A stye can cause redness, swelling, and pain on the eyelid.  Your health care provider may be able to diagnose a stye just by examining your eye.  Apply a warm, wet cloth (warm compress) to your eye for 5-10 minutes, 4 times a day. This information is not intended to replace advice given to you by your health care provider. Make sure you discuss any questions you have with your health care provider. Document Revised: 04/10/2017 Document Reviewed: 01/08/2017 Elsevier Patient Education  2020 [employer].

## 2019-06-22 ENCOUNTER — Encounter: Payer: Self-pay | Admitting: Family Medicine

## 2019-06-22 ENCOUNTER — Ambulatory Visit: Payer: Medicare PPO | Admitting: Family Medicine

## 2019-06-22 ENCOUNTER — Other Ambulatory Visit: Payer: Self-pay

## 2019-06-22 VITALS — BP 118/78 | HR 92 | Temp 97.3°F | Ht 60.0 in | Wt 154.0 lb

## 2019-06-22 DIAGNOSIS — E119 Type 2 diabetes mellitus without complications: Secondary | ICD-10-CM

## 2019-06-22 DIAGNOSIS — E7841 Elevated Lipoprotein(a): Secondary | ICD-10-CM | POA: Diagnosis not present

## 2019-06-22 DIAGNOSIS — I10 Essential (primary) hypertension: Secondary | ICD-10-CM | POA: Diagnosis not present

## 2019-06-22 LAB — LIPID PANEL
Cholesterol: 166 mg/dL (ref 0–200)
HDL: 65.4 mg/dL (ref >39.00)
LDL Cholesterol: 80 mg/dL (ref 0–99)
NonHDL: 100.41
Total CHOL/HDL Ratio: 3
Triglycerides: 101 mg/dL (ref 0.0–149.0)
VLDL: 20.2 mg/dL (ref 0.0–40.0)

## 2019-06-22 LAB — MICROALBUMIN / CREATININE URINE RATIO
Creatinine,U: 171.6 mg/dL
Microalb Creat Ratio: 2.9 mg/g (ref 0.0–30.0)
Microalb, Ur: 5.1 mg/dL — ABNORMAL HIGH (ref 0.0–1.9)

## 2019-06-22 NOTE — Patient Instructions (Signed)
Good to see you today  Please schedule your Annual Wellness Visit with the wellness nurse, and your complete physical with me for August 2021.

## 2019-06-22 NOTE — Progress Notes (Signed)
   Subjective:    Patient ID: Claire Drake, female    DOB: 06-Jun-1940, 79 y.o.   MRN: [id number]  HPI Chief Complaint  Patient presents with  . Follow-up    Pt states that her BP is still elevated.  Pt brought BP cuff with her to see if her elevated numbers are correct or if her machine is messed up -- Her machine read 175/94, HR 64 -- in office manual cuff read 118/78   This is a 79 year old female who presents today to follow-up chronic medical conditions.  She will be getting her first dose of Covid 19 immunization later this week.   Hypertension-she brings in home readings.  When compared to in office readings they are significantly elevated.  She has been feeling well and denies headache or visual changes.  Hyperlipidemia-6 months ago was started on low-dose statin.  Will check lipids today.  She denies any side effects  Osteoporosis-tolerating alendronate without side effects.  Diabetes mellitus-last hemoglobin A1c 12/16/2018 was 6.5.  Has been watching her diet and has lost several pounds.   Review of Systems    Per HPI Objective:   Physical Exam Vitals reviewed.  Constitutional:      Appearance: Normal appearance. She is obese.  HENT:     Head: Normocephalic and atraumatic.  Eyes:     Conjunctiva/sclera: Conjunctivae normal.  Cardiovascular:     Rate and Rhythm: Normal rate.  Pulmonary:     Effort: Pulmonary effort is normal.  Neurological:     Mental Status: She is alert and oriented to person, place, and time.  Psychiatric:        Mood and Affect: Mood normal.        Behavior: Behavior normal.        Thought Content: Thought content normal.        Judgment: Judgment normal.       BP 118/78 (BP Location: Left Arm, Patient Position: Sitting, Cuff Size: Normal)   Pulse 92   Temp (!) 97.3 F (36.3 C) (Temporal)   Ht 5' (1.524 m)   Wt 154 lb (69.9 kg)   SpO2 98%   BMI 30.08 kg/m  Wt Readings from Last 3 Encounters:  06/22/19 154 lb (69.9 kg)  05/16/19 157  lb 6.4 oz (71.4 kg)  04/25/19 153 lb 8 oz (69.6 kg)   BP Readings from Last 3 Encounters:  06/22/19 118/78  05/16/19 (!) 144/82  04/25/19 (!) 182/80       Assessment & Plan:  1. Elevated lipoprotein(a) -Tolerating statin, will recheck labs today - Lipid Panel  2. Type 2 diabetes mellitus without complication, without long-term current use of insulin ([facility]) -Well-controlled per last hemoglobin A1c - Lipid Panel - Microalbumin / creatinine urine ratio  3.  Essential hypertension -Her home blood pressure cuff does not seem to be reliable as the reading when compared to manual is way off.  Encouraged her to not check with her home cuff. -Blood pressure perfect today  -Follow-up in 6 months for CPE/labs  This visit occurred during the SARS-CoV-2 public health emergency.  Safety protocols were in place, including screening questions prior to the visit, additional usage of staff PPE, and extensive cleaning of exam room while observing appropriate contact time as indicated for disinfecting solutions.    Clarene Reamer, FNP-BC  [place] [facility] at [facility], [place] [facility]  06/22/2019 10:19 AM

## 2019-07-05 ENCOUNTER — Other Ambulatory Visit (HOSPITAL_COMMUNITY)
Admission: RE | Admit: 2019-07-05 | Discharge: 2019-07-05 | Disposition: A | Discharge status: Home / Self Care | Payer: Medicare PPO | Source: Ambulatory Visit | Attending: Obstetrics and Gynecology | Admitting: Obstetrics and Gynecology

## 2019-07-05 ENCOUNTER — Ambulatory Visit: Payer: Medicare PPO | Admitting: Obstetrics and Gynecology

## 2019-07-05 ENCOUNTER — Other Ambulatory Visit: Payer: Self-pay

## 2019-07-05 ENCOUNTER — Encounter: Payer: Self-pay | Admitting: Obstetrics and Gynecology

## 2019-07-05 VITALS — BP 185/85 | HR 61 | Wt 153.0 lb

## 2019-07-05 DIAGNOSIS — R87615 Unsatisfactory cytologic smear of cervix: Secondary | ICD-10-CM | POA: Diagnosis not present

## 2019-07-05 DIAGNOSIS — Z8542 Personal history of malignant neoplasm of other parts of uterus: Secondary | ICD-10-CM | POA: Diagnosis not present

## 2019-07-05 DIAGNOSIS — Z1272 Encounter for screening for malignant neoplasm of vagina: Secondary | ICD-10-CM | POA: Diagnosis not present

## 2019-07-05 DIAGNOSIS — C55 Malignant neoplasm of uterus, part unspecified: Secondary | ICD-10-CM | POA: Diagnosis not present

## 2019-07-05 DIAGNOSIS — Z90710 Acquired absence of both cervix and uterus: Secondary | ICD-10-CM | POA: Diagnosis not present

## 2019-07-05 NOTE — Progress Notes (Signed)
Obstetrics and Gynecology Visit Return Patient Evaluation  Appointment Date: 07/05/2019  Primary Care Provider: Gessner, [place] for [facility]  Chief Complaint: pelvic exam. Follow up endometrial cancer  History of Present Illness:  Claire Drake is a 79 y.o. s/p 09/09/2017 TLH/BSO and bilateral sentinel pelvic LN sampling. 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 Patient's last exam was in 01/2018 by Gyn Onc; it was negative and  plans were for q33m alternating pelvic exams with me and them.   Interval History: She denies ever having any post op vaginal bleeding, spotting, discharge, pain, blood in BMs or urine.   Review of Systems: as noted in the History of Present Illness. Medications:  Cassadi Moffa. Sitzman had no medications administered during this visit. Current Outpatient Medications  Medication Sig Dispense Refill  . aspirin EC 81 MG tablet Take 81 mg by mouth daily.    Marland Kitchen atorvastatin (LIPITOR) 10 MG tablet Take 1 tablet (10 mg total) by mouth daily. 90 tablet 3  . diphenhydrAMINE (BENADRYL) 25 mg capsule Take 25 mg by mouth at bedtime as needed for allergies or sleep.    Marland Kitchen lisinopril (ZESTRIL) 20 MG tablet Take 1 tablet (20 mg total) by mouth daily. 90 tablet 1  . metFORMIN (GLUCOPHAGE) 500 MG tablet TAKE ONE TABLET BY MOUTH DAILY WITH SUPPER. KEEP APPOINTMENT IN AUGUST FOR FOLLOW UP. 90 tablet 2  . Vitamin D, Ergocalciferol, (DRISDOL) 1.25 MG (50000 UT) CAPS capsule TAKE 1 CAPSULE EVERY 7 DAYS 12 capsule 3  . alendronate (FOSAMAX) 70 MG tablet TAKE 1 TABLET BY MOUTH EVERY 7 DAYS. TAKE WITH FULL GLASS OF WATER ONEMPTY STOMACH 12 tablet 3  . calcium elemental as carbonate (TUMS ULTRA 1000) 400 MG chewable tablet Chew 1,000 mg by mouth daily as needed for heartburn.    Marland Kitchen ibuprofen (ADVIL,MOTRIN) 600 MG tablet Take 1 tablet (600 mg total) by mouth  every 6 (six) hours. 65 tablet 1  . naproxen sodium (ALEVE) 220 MG tablet Take 220-440 mg by mouth daily as needed (pain).     No current facility-administered medications for this visit.    Allergies: is allergic to seasonal ic [cholestatin].  Physical Exam:  BP (!) 185/85   Pulse 61   Wt 153 lb (69.4 kg)   BMI 29.88 kg/m  Body mass index is 29.88 kg/m. General appearance: Well nourished, well developed female in no acute distress. CV: normal s1 and s2, no MRGs Pulm: CTAB  Abdomen: diffusely non tender to palpation, non distended, and no masses, hernias Neuro/Psych:  Normal mood and affect.    Pelvic exam:  EGBUS: normal, mild atrophy Vagina: normal, mild atrophy, no blood or discharge Cuff: normal, nttp, no masses felt RV: negative. Bimanual: negative   Assessment: pt doing well  Plan:  1. Malignant neoplasm of uterus, unspecified site [facility]) F/u pap Rpt exam in 38m if not sooner per Gyn Onc - Cytology - PAP( [place])   RTC: 1 year  Durene Romans MD Attending Center for [employer] farm manager)

## 2019-07-08 LAB — CYTOLOGY - PAP
Adequacy: ABNORMAL
Comment: NEGATIVE
High risk HPV: NEGATIVE

## 2019-07-19 ENCOUNTER — Telehealth: Payer: Self-pay | Admitting: *Deleted

## 2019-07-19 NOTE — Telephone Encounter (Signed)
Informed pt of results and Dr Ilda Basset recommendations to change appt. Pt verbalizes and understands.

## 2019-07-19 NOTE — Telephone Encounter (Signed)
-----   Message from Aletha Halim, MD sent at 07/19/2019  7:43 AM EST ----- Can you let her know that everything looks good and to call the cancer center to push out her appointment to six months from now instead of her appointment in June? thanks

## 2019-08-03 ENCOUNTER — Other Ambulatory Visit: Payer: Self-pay | Admitting: Family Medicine

## 2019-08-03 DIAGNOSIS — E559 Vitamin D deficiency, unspecified: Secondary | ICD-10-CM

## 2019-08-03 DIAGNOSIS — I10 Essential (primary) hypertension: Secondary | ICD-10-CM

## 2019-08-04 NOTE — Telephone Encounter (Signed)
Last time Vit D level was checked on 03/26/2018 and level was 37.40. Last filled on 08/04/2018 #12 with 3 refills.

## 2019-10-12 ENCOUNTER — Other Ambulatory Visit: Payer: Self-pay | Admitting: Family Medicine

## 2019-10-12 DIAGNOSIS — E1165 Type 2 diabetes mellitus with hyperglycemia: Secondary | ICD-10-CM

## 2019-10-19 ENCOUNTER — Ambulatory Visit: Payer: Medicare Other

## 2019-12-16 ENCOUNTER — Other Ambulatory Visit: Payer: Self-pay | Admitting: Family Medicine

## 2019-12-16 DIAGNOSIS — E559 Vitamin D deficiency, unspecified: Secondary | ICD-10-CM

## 2019-12-16 DIAGNOSIS — E119 Type 2 diabetes mellitus without complications: Secondary | ICD-10-CM

## 2019-12-16 DIAGNOSIS — I10 Essential (primary) hypertension: Secondary | ICD-10-CM

## 2019-12-16 DIAGNOSIS — M810 Age-related osteoporosis without current pathological fracture: Secondary | ICD-10-CM

## 2019-12-19 ENCOUNTER — Other Ambulatory Visit (INDEPENDENT_AMBULATORY_CARE_PROVIDER_SITE_OTHER): Payer: Medicare PPO

## 2019-12-19 ENCOUNTER — Ambulatory Visit: Payer: Medicare PPO

## 2019-12-19 ENCOUNTER — Other Ambulatory Visit: Payer: Self-pay

## 2019-12-19 DIAGNOSIS — M810 Age-related osteoporosis without current pathological fracture: Secondary | ICD-10-CM | POA: Diagnosis not present

## 2019-12-19 DIAGNOSIS — E559 Vitamin D deficiency, unspecified: Secondary | ICD-10-CM

## 2019-12-19 DIAGNOSIS — E119 Type 2 diabetes mellitus without complications: Secondary | ICD-10-CM | POA: Diagnosis not present

## 2019-12-19 DIAGNOSIS — I10 Essential (primary) hypertension: Secondary | ICD-10-CM | POA: Diagnosis not present

## 2019-12-19 LAB — CBC WITH DIFFERENTIAL/PLATELET
Basophils Absolute: 0 10*3/uL (ref 0.0–0.1)
Basophils Relative: 0.7 % (ref 0.0–3.0)
Eosinophils Absolute: 0.3 10*3/uL (ref 0.0–0.7)
Eosinophils Relative: 4.6 % (ref 0.0–5.0)
HCT: 41.8 % (ref 36.0–46.0)
Hemoglobin: 13.8 g/dL (ref 12.0–15.0)
Lymphocytes Relative: 16.4 % (ref 12.0–46.0)
Lymphs Abs: 0.9 10*3/uL (ref 0.7–4.0)
MCHC: 33.1 g/dL (ref 30.0–36.0)
MCV: 98.1 fl (ref 78.0–100.0)
Monocytes Absolute: 0.5 10*3/uL (ref 0.1–1.0)
Monocytes Relative: 9.4 % (ref 3.0–12.0)
Neutro Abs: 3.9 10*3/uL (ref 1.4–7.7)
Neutrophils Relative %: 68.9 % (ref 43.0–77.0)
Platelets: 182 10*3/uL (ref 150.0–400.0)
RBC: 4.26 Mil/uL (ref 3.87–5.11)
RDW: 13.6 % (ref 11.5–15.5)
WBC: 5.7 10*3/uL (ref 4.0–10.5)

## 2019-12-19 LAB — COMPREHENSIVE METABOLIC PANEL
ALT: 16 U/L (ref 0–35)
AST: 18 U/L (ref 0–37)
Albumin: 4.1 g/dL (ref 3.5–5.2)
Alkaline Phosphatase: 44 U/L (ref 39–117)
BUN: 16 mg/dL (ref 6–23)
CO2: 28 mEq/L (ref 19–32)
Calcium: 9.8 mg/dL (ref 8.4–10.5)
Chloride: 103 mEq/L (ref 96–112)
Creatinine, Ser: 0.74 mg/dL (ref 0.40–1.20)
GFR: 75.46 mL/min (ref >60.00)
Glucose, Bld: 123 mg/dL — ABNORMAL HIGH (ref 70–99)
Potassium: 4.5 mEq/L (ref 3.5–5.1)
Sodium: 140 mEq/L (ref 135–145)
Total Bilirubin: 0.6 mg/dL (ref 0.2–1.2)
Total Protein: 6.7 g/dL (ref 6.0–8.3)

## 2019-12-19 LAB — LIPID PANEL
Cholesterol: 146 mg/dL (ref 0–200)
HDL: 64.1 mg/dL (ref >39.00)
LDL Cholesterol: 68 mg/dL (ref 0–99)
NonHDL: 82.19
Total CHOL/HDL Ratio: 2
Triglycerides: 72 mg/dL (ref 0.0–149.0)
VLDL: 14.4 mg/dL (ref 0.0–40.0)

## 2019-12-19 LAB — VITAMIN D 25 HYDROXY (VIT D DEFICIENCY, FRACTURES): VITD: 60.12 ng/mL (ref 30.00–100.00)

## 2019-12-19 LAB — HEMOGLOBIN A1C: Hgb A1c MFr Bld: 6.1 % (ref 4.6–6.5)

## 2019-12-20 ENCOUNTER — Ambulatory Visit (INDEPENDENT_AMBULATORY_CARE_PROVIDER_SITE_OTHER): Payer: Medicare PPO

## 2019-12-20 DIAGNOSIS — Z0000 Encounter for general adult medical examination without abnormal findings: Secondary | ICD-10-CM

## 2019-12-20 NOTE — Progress Notes (Signed)
PCP notes:  Health Maintenance: Tdap- insurance/financial Mammo- due Dexa- due Foot exam- due Eye exam- due   Abnormal Screenings: none   Patient concerns: none   Nurse concerns: none   Next PCP appt.: 12/23/2019 @ 8:30 am

## 2019-12-20 NOTE — Progress Notes (Signed)
Subjective:   Claire Drake is a 79 y.o. female who presents for Medicare Annual (Subsequent) preventive examination.  Review of Systems: N/A      I connected with the patient today by telephone and verified that I am speaking with the correct person using two identifiers. Location patient: home Location nurse: work Persons participating in the virtual visit: patient, Marine scientist.   I discussed the limitations, risks, security and privacy concerns of performing an evaluation and management service by telephone and the availability of in person appointments. I also discussed with the patient that there may be a patient responsible charge related to this service. The patient expressed understanding and verbally consented to this telephonic visit.    Interactive audio and video telecommunications were attempted between this nurse and patient, however failed, due to patient having technical difficulties OR patient did not have access to video capability.  We continued and completed visit with audio only.     Cardiac Risk Factors include: advanced age (>38men, >95 women);diabetes mellitus;hypertension     Objective:    Today's Vitals   There is no height or weight on file to calculate BMI.  Advanced Directives 12/20/2019 02/15/2019 01/20/2018 10/14/2017 09/09/2017 09/01/2017 08/19/2017  Does Patient Have a Medical Advance Directive? Yes Yes Yes Yes Yes Yes No  Type of Paramedic of [place];Living will [place];Living will [place];Living will [place];Living will [place];Living will [place];Living will -  Does patient want to make changes to medical advance directive? - No - Patient declined No - Patient declined - No - Patient declined No - Patient declined -  Copy of [place] in Chart? No - copy requested No - copy requested Yes No - copy requested No - copy  requested No - copy requested No - copy requested  Would patient like information on creating a medical advance directive? - No - Patient declined No - Patient declined - - No - Patient declined No - Patient declined    Current Medications (verified) Outpatient Encounter Medications as of 12/20/2019  Medication Sig  . alendronate (FOSAMAX) 70 MG tablet TAKE 1 TABLET BY MOUTH EVERY 7 DAYS. TAKE WITH FULL GLASS OF WATER ONEMPTY STOMACH  . aspirin EC 81 MG tablet Take 81 mg by mouth daily.  Marland Kitchen atorvastatin (LIPITOR) 10 MG tablet Take 1 tablet (10 mg total) by mouth daily.  . calcium elemental as carbonate (TUMS ULTRA 1000) 400 MG chewable tablet Chew 1,000 mg by mouth daily as needed for heartburn.  . diphenhydrAMINE (BENADRYL) 25 mg capsule Take 25 mg by mouth at bedtime as needed for allergies or sleep.  Marland Kitchen ibuprofen (ADVIL,MOTRIN) 600 MG tablet Take 1 tablet (600 mg total) by mouth every 6 (six) hours.  Marland Kitchen lisinopril (ZESTRIL) 20 MG tablet TAKE ONE TABLET BY MOUTH EVERY DAY  . metFORMIN (GLUCOPHAGE) 500 MG tablet TAKE ONE TABLET BY MOUTH DAILY WITH SUPPER. KEEP APPOINTMENT IN AUGUST FOR FOLLOW UP.  . naproxen sodium (ALEVE) 220 MG tablet Take 220-440 mg by mouth daily as needed (pain).  . Vitamin D, Ergocalciferol, (DRISDOL) 1.25 MG (50000 UNIT) CAPS capsule TAKE 1 CAPSULE BY MOUTH EVERY 7 DAYS   No facility-administered encounter medications on file as of 12/20/2019.    Allergies (verified) Seasonal ic [cholestatin]   History: Past Medical History:  Diagnosis Date  . Arthritis   . Cancer ([place]) 08/2017   endometrial cancer; skin cancer  on lip and one on head  . Dysrhythmia    seeing Dr. Rockey Situ 09/07/2017  . GERD (gastroesophageal reflux disease)    occasionally  . Hypertension 08/2017   just recently with new diagnosis  . Rectocele 2019  . Uncontrolled type 2 diabetes mellitus with hyperglycemia ([place]) 08/06/2017   A1C 8.7  07/2017   Past Surgical History:  Procedure Laterality Date    . ABDOMINAL HYSTERECTOMY    . APPENDECTOMY  1970  . CATARACT EXTRACTION, BILATERAL  05/12/2002  . CHOLECYSTECTOMY  1978  . LAPAROSCOPIC HYSTERECTOMY N/A 09/09/2017   Procedure: HYSTERECTOMY TOTAL LAPAROSCOPIC WITH REMOVAL OF OVARIES AND TUBES, SENTINEL LYMPH NODE MAPPING AND BIOPSIES;  Surgeon: Mellody Drown, MD;  Location: [facility] ORS;  Service: Gynecology;  Laterality: N/A;  . TUBAL LIGATION Bilateral 1980   Family History  Problem Relation Age of Onset  . Dementia Mother   . Heart disease Father   . Heart attack Father   . Diabetes Maternal Grandmother   . Heart attack Maternal Grandfather   . Breast cancer Neg Hx    Social History   Socioeconomic History  . Marital status: Widowed    Spouse name: Not on file  . Number of children: Not on file  . Years of education: Not on file  . Highest education level: Not on file  Occupational History  . Not on file  Tobacco Use  . Smoking status: Never Smoker  . Smokeless tobacco: Never Used  Vaping Use  . Vaping Use: Never used  Substance and Sexual Activity  . Alcohol use: No  . Drug use: No  . Sexual activity: Never  Other Topics Concern  . Not on file  Social History Narrative  . Not on file   Social Determinants of Health   Financial Resource Strain: Low Risk   . Difficulty of Paying Living Expenses: Not hard at all  Food Insecurity: No Food Insecurity  . Worried About Charity fundraiser in the Last Year: Never true  . Ran Out of Food in the Last Year: Never true  Transportation Needs: No Transportation Needs  . Lack of Transportation (Medical): No  . Lack of Transportation (Non-Medical): No  Physical Activity: Sufficiently Active  . Days of Exercise per Week: 7 days  . Minutes of Exercise per Session: 30 min  Stress: No Stress Concern Present  . Feeling of Stress : Not at all  Social Connections:   . Frequency of Communication with Friends and Family:   . Frequency of Social Gatherings with Friends and Family:    . Attends Religious Services:   . Active Member of Clubs or Organizations:   . Attends Archivist Meetings:   Marland Kitchen Marital Status:     Tobacco Counseling Counseling given: Not Answered   Clinical Intake:  Pre-visit preparation completed: Yes  Pain : No/denies pain     Nutritional Risks: None Diabetes: Yes CBG done?: No Did pt. bring in CBG monitor from home?: No  How often do you need to have someone help you when you read instructions, pamphlets, or other written materials from your doctor or pharmacy?: 1 - Never What is the last grade level you completed in school?: Masters degree  Diabetic: Yes Nutrition Risk Assessment:  Has the patient had any N/V/D within the last 2 months?  No  Does the patient have any non-healing wounds?  No  Has the patient had any unintentional weight loss or weight gain?  No   Diabetes:  Is the patient diabetic?  Yes  If diabetic, was a CBG obtained today?  No  Did the patient bring in their glucometer from home?  No  How often do you monitor your CBG's? When needed.   Financial Strains and Diabetes Management:  Are you having any financial strains with the device, your supplies or your medication? No .  Does the patient want to be seen by Chronic Care Management for management of their diabetes?  No  Would the patient like to be referred to a Nutritionist or for Diabetic Management?  No   Diabetic Exams:  Diabetic Eye Exam: Overdue for diabetic eye exam. Pt has been advised about the importance in completing this exam. Patient advised to call and schedule an eye exam. Diabetic Foot Exam: Overdue, Pt has been advised about the importance in completing this exam. Pt is scheduled for diabetic foot exam on 12/23/2019.   Interpreter Needed?: No  Information entered by :: CJohnson, LPN   Activities of Daily Living In your present state of health, do you have any difficulty performing the following activities: 12/20/2019   Hearing? N  Vision? N  Difficulty concentrating or making decisions? N  Walking or climbing stairs? N  Dressing or bathing? N  Doing errands, shopping? N  Preparing Food and eating ? N  Using the Toilet? N  In the past six months, have you accidently leaked urine? N  Do you have problems with loss of bowel control? N  Managing your Medications? N  Managing your Finances? N  Housekeeping or managing your Housekeeping? N  Some recent data might be hidden    Patient Care Team: Elby Beck, FNP as PCP - General (Nurse Practitioner)  Indicate any recent Medical Services you may have received from other than [facility] providers in the past year (date may be approximate).     Assessment:   This is a routine wellness examination for [place].  Hearing/Vision screen  Hearing Screening   125Hz  250Hz  500Hz  1000Hz  2000Hz  3000Hz  4000Hz  6000Hz  8000Hz   Right ear:           Left ear:           Vision Screening Comments: Patient gets annual eye exams   Dietary issues and exercise activities discussed: Current Exercise Habits: Home exercise routine, Type of exercise: walking, Time (Minutes): 30, Frequency (Times/Week): 7, Weekly Exercise (Minutes/Week): 210, Intensity: Moderate, Exercise limited by: None identified  Goals    . Increase physical activity     Starting 07/29/2017, I will continue to walk for 20 minutes daily.    . Patient Stated     12/20/2019, I will continue to walk every morning for about 30 minutes.       Depression Screen PHQ 2/9 Scores 12/20/2019 12/17/2018 07/29/2017 07/01/2017  PHQ - 2 Score 0 0 0 0  PHQ- 9 Score 0 - 0 -    Fall Risk Fall Risk  12/20/2019 12/17/2018 07/29/2017 07/01/2017  Falls in the past year? 0 0 Yes Yes  Number falls in past yr: 0 - 2 or more 2 or more  Injury with Fall? 0 - Yes -  Risk for fall due to : Medication side effect - - -  Follow up Falls evaluation completed;Falls prevention discussed - - -    Any stairs in or around the home? Yes   If so, are there any without handrails? No  Home free of loose throw rugs in walkways, pet beds, electrical cords, etc? Yes  Adequate lighting in your home to reduce risk of falls? Yes   ASSISTIVE DEVICES UTILIZED TO PREVENT FALLS:  Life alert? No  Use of a cane, walker or w/c? No  Grab bars in the bathroom? No  Shower chair or bench in shower? No  Elevated toilet seat or a handicapped toilet? No   TIMED UP AND GO:  Was the test performed? N/A, telephonic visit .    Cognitive Function: MMSE - Mini Mental State Exam 12/20/2019 07/29/2017  Orientation to time 5 5  Orientation to Place 5 5  Registration 3 3  Attention/ Calculation 5 0  Recall 3 3  Language- name 2 objects - 0  Language- repeat 1 1  Language- follow 3 step command - 3  Language- read & follow direction - 0  Write a sentence - 0  Copy design - 0  Total score - 20  Mini Cog  Mini-Cog screen was completed. Maximum score is 22. A value of 0 denotes this part of the MMSE was not completed or the patient failed this part of the Mini-Cog screening.       Immunizations Immunization History  Administered Date(s) Administered  . Influenza, High Dose Seasonal PF 02/22/2019  . Influenza,inj,Quad PF,6+ Mos 02/09/2017  . Moderna SARS-COVID-2 Vaccination 06/24/2019, 07/22/2019  . Pneumococcal Conjugate-13 07/01/2017  . Pneumococcal Polysaccharide-23 12/17/2018    TDAP status: Due, Education has been provided regarding the importance of this vaccine. Advised may receive this vaccine at local pharmacy or Health Dept. Aware to provide a copy of the vaccination record if obtained from local pharmacy or Health Dept. Verbalized acceptance and understanding. Flu Vaccine status: due, will be available at the office at the end of August  Pneumococcal vaccine status: Up to date Covid-19 vaccine status: Completed vaccines  Qualifies for Shingles Vaccine? Yes   Zostavax completed No   Shingrix Completed?: No.    Education has  been provided regarding the importance of this vaccine. Patient has been advised to call insurance company to determine out of pocket expense if they have not yet received this vaccine. Advised may also receive vaccine at local pharmacy or Health Dept. Verbalized acceptance and understanding.  Screening Tests Health Maintenance  Topic Date Due  . FOOT EXAM  Never done  . OPHTHALMOLOGY EXAM  Never done  . INFLUENZA VACCINE  12/11/2019  . TETANUS/TDAP  12/20/2023 (Originally 09/12/1959)  . HEMOGLOBIN A1C  06/20/2020  . DEXA SCAN  Completed  . COVID-19 Vaccine  Completed  . PNA vac Low Risk Adult  Completed    Health Maintenance  Health Maintenance Due  Topic Date Due  . FOOT EXAM  Never done  . OPHTHALMOLOGY EXAM  Never done  . INFLUENZA VACCINE  12/11/2019    Colorectal cancer screening: No longer required.  Mammogram status: due, Patient will call to setup an appointment Bone Density status: due, Patient will call to setup an appointment  Lung Cancer Screening: (Low Dose CT Chest recommended if Age 29-80 years, 30 pack-year currently smoking OR have quit w/in 15 years.) does not qualify.    Additional Screening:  Hepatitis C Screening: does not qualify; Completed N/A  Vision Screening: Recommended annual ophthalmology exams for early detection of glaucoma and other disorders of the eye. Is the patient up to date with their annual eye exam?  Yes  Who is the provider or what is the name of the office in which the patient attends annual eye exams? In the process of changing doctors If  pt is not established with a provider, would they like to be referred to a provider to establish care? No .   Dental Screening: Recommended annual dental exams for proper oral hygiene  Community Resource Referral / Chronic Care Management: CRR required this visit?  No   CCM required this visit?  No      Plan:     I have personally reviewed and noted the following in the patient's chart:    . Medical and social history . Use of alcohol, tobacco or illicit drugs  . Current medications and supplements . Functional ability and status . Nutritional status . Physical activity . Advanced directives . List of other physicians . Hospitalizations, surgeries, and ER visits in previous 12 months . Vitals . Screenings to include cognitive, depression, and falls . Referrals and appointments  In addition, I have reviewed and discussed with patient certain preventive protocols, quality metrics, and best practice recommendations. A written personalized care plan for preventive services as well as general preventive health recommendations were provided to patient.   Due to this being a telephonic visit, the after visit summary with patients personalized plan was offered to patient via mail or my-chart. Patient preferred to pick up at office at next visit.   Andrez Grime, LPN   06/21/4707

## 2019-12-20 NOTE — Patient Instructions (Signed)
Claire Drake , Thank you for taking time to come for your Medicare Wellness Visit. I appreciate your ongoing commitment to your health goals. Please review the following plan we discussed and let me know if I can assist you in the future.   Screening recommendations/referrals: Colonoscopy: no longer required Mammogram: due, Patient will call to schedule appointment Bone Density: due, Patient will call to schedule appointment Recommended yearly ophthalmology/optometry visit for glaucoma screening and checkup Recommended yearly dental visit for hygiene and checkup  Vaccinations: Influenza vaccine: due, will be available in the office at the end of August Pneumococcal vaccine: Completed series Tdap vaccine: decline- insurance/financial Shingles vaccine: due, check with your insurance regarding coverage   Covid-19:Completed series  Advanced directives: Please bring a copy of your POA (Power of [place]) and/or Living Will to your next appointment.  Conditions/risks identified: Diabetes, hypertension  Next appointment: Follow up in one year for your annual wellness visit    Preventive Care 80 Years and Older, Female Preventive care refers to lifestyle choices and visits with your health care provider that can promote health and wellness. What does preventive care include?  A yearly physical exam. This is also called an annual well check.  Dental exams once or twice a year.  Routine eye exams. Ask your health care provider how often you should have your eyes checked.  Personal lifestyle choices, including:  Daily care of your teeth and gums.  Regular physical activity.  Eating a healthy diet.  Avoiding tobacco and drug use.  Limiting alcohol use.  Practicing safe sex.  Taking low-dose aspirin every day.  Taking vitamin and mineral supplements as recommended by your health care provider. What happens during an annual well check? The services and screenings done by your health  care provider during your annual well check will depend on your age, overall health, lifestyle risk factors, and family history of disease. Counseling  Your health care provider may ask you questions about your:  Alcohol use.  Tobacco use.  Drug use.  Emotional well-being.  Home and relationship well-being.  Sexual activity.  Eating habits.  History of falls.  Memory and ability to understand (cognition).  Work and work Statistician.  Reproductive health. Screening  You may have the following tests or measurements:  Height, weight, and BMI.  Blood pressure.  Lipid and cholesterol levels. These may be checked every 5 years, or more frequently if you are over 24 years old.  Skin check.  Lung cancer screening. You may have this screening every year starting at age 83 if you have a 30-pack-year history of smoking and currently smoke or have quit within the past 15 years.  Fecal occult blood test (FOBT) of the stool. You may have this test every year starting at age 73.  Flexible sigmoidoscopy or colonoscopy. You may have a sigmoidoscopy every 5 years or a colonoscopy every 10 years starting at age 27.  Hepatitis C blood test.  Hepatitis B blood test.  Sexually transmitted disease (STD) testing.  Diabetes screening. This is done by checking your blood sugar (glucose) after you have not eaten for a while (fasting). You may have this done every 1-3 years.  Bone density scan. This is done to screen for osteoporosis. You may have this done starting at age 60.  Mammogram. This may be done every 1-2 years. Talk to your health care provider about how often you should have regular mammograms. Talk with your health care provider about your test results, treatment options, and  if necessary, the need for more tests. Vaccines  Your health care provider may recommend certain vaccines, such as:  Influenza vaccine. This is recommended every year.  Tetanus, diphtheria, and  acellular pertussis (Tdap, Td) vaccine. You may need a Td booster every 10 years.  Zoster vaccine. You may need this after age 75.  Pneumococcal 13-valent conjugate (PCV13) vaccine. One dose is recommended after age 49.  Pneumococcal polysaccharide (PPSV23) vaccine. One dose is recommended after age 30. Talk to your health care provider about which screenings and vaccines you need and how often you need them. This information is not intended to replace advice given to you by your health care provider. Make sure you discuss any questions you have with your health care provider. Document Released: 05/25/2015 Document Revised: 01/16/2016 Document Reviewed: 02/27/2015 Elsevier Interactive Patient Education  2017 [place] Prevention in the Home Falls can cause injuries. They can happen to people of all ages. There are many things you can do to make your home safe and to help prevent falls. What can I do on the outside of my home?  Regularly fix the edges of walkways and driveways and fix any cracks.  Remove anything that might make you trip as you walk through a door, such as a raised step or threshold.  Trim any bushes or trees on the path to your home.  Use bright outdoor lighting.  Clear any walking paths of anything that might make someone trip, such as rocks or tools.  Regularly check to see if handrails are loose or broken. Make sure that both sides of any steps have handrails.  Any raised decks and porches should have guardrails on the edges.  Have any leaves, snow, or ice cleared regularly.  Use sand or salt on walking paths during winter.  Clean up any spills in your garage right away. This includes oil or grease spills. What can I do in the bathroom?  Use night lights.  Install grab bars by the toilet and in the tub and shower. Do not use towel bars as grab bars.  Use non-skid mats or decals in the tub or shower.  If you need to sit down in the shower, use  a plastic, non-slip stool.  Keep the floor dry. Clean up any water that spills on the floor as soon as it happens.  Remove soap buildup in the tub or shower regularly.  Attach bath mats securely with double-sided non-slip rug tape.  Do not have throw rugs and other things on the floor that can make you trip. What can I do in the bedroom?  Use night lights.  Make sure that you have a light by your bed that is easy to reach.  Do not use any sheets or blankets that are too big for your bed. They should not hang down onto the floor.  Have a firm chair that has side arms. You can use this for support while you get dressed.  Do not have throw rugs and other things on the floor that can make you trip. What can I do in the kitchen?  Clean up any spills right away.  Avoid walking on wet floors.  Keep items that you use a lot in easy-to-reach places.  If you need to reach something above you, use a strong step stool that has a grab bar.  Keep electrical cords out of the way.  Do not use floor polish or wax that makes floors slippery. If you  must use wax, use non-skid floor wax.  Do not have throw rugs and other things on the floor that can make you trip. What can I do with my stairs?  Do not leave any items on the stairs.  Make sure that there are handrails on both sides of the stairs and use them. Fix handrails that are broken or loose. Make sure that handrails are as long as the stairways.  Check any carpeting to make sure that it is firmly attached to the stairs. Fix any carpet that is loose or worn.  Avoid having throw rugs at the top or bottom of the stairs. If you do have throw rugs, attach them to the floor with carpet tape.  Make sure that you have a light switch at the top of the stairs and the bottom of the stairs. If you do not have them, ask someone to add them for you. What else can I do to help prevent falls?  Wear shoes that:  Do not have high heels.  Have  rubber bottoms.  Are comfortable and fit you well.  Are closed at the toe. Do not wear sandals.  If you use a stepladder:  Make sure that it is fully opened. Do not climb a closed stepladder.  Make sure that both sides of the stepladder are locked into place.  Ask someone to hold it for you, if possible.  Clearly mark and make sure that you can see:  Any grab bars or handrails.  First and last steps.  Where the edge of each step is.  Use tools that help you move around (mobility aids) if they are needed. These include:  Canes.  Walkers.  Scooters.  Crutches.  Turn on the lights when you go into a dark area. Replace any light bulbs as soon as they burn out.  Set up your furniture so you have a clear path. Avoid moving your furniture around.  If any of your floors are uneven, fix them.  If there are any pets around you, be aware of where they are.  Review your medicines with your doctor. Some medicines can make you feel dizzy. This can increase your chance of falling. Ask your doctor what other things that you can do to help prevent falls. This information is not intended to replace advice given to you by your health care provider. Make sure you discuss any questions you have with your health care provider. Document Released: 02/22/2009 Document Revised: 10/04/2015 Document Reviewed: 06/02/2014 Elsevier Interactive Patient Education  2017 [employer].

## 2019-12-23 ENCOUNTER — Other Ambulatory Visit: Payer: Self-pay

## 2019-12-23 ENCOUNTER — Ambulatory Visit (INDEPENDENT_AMBULATORY_CARE_PROVIDER_SITE_OTHER): Payer: Medicare PPO | Admitting: Family Medicine

## 2019-12-23 ENCOUNTER — Encounter: Payer: Self-pay | Admitting: Family Medicine

## 2019-12-23 VITALS — BP 146/82 | HR 78 | Temp 98.6°F | Ht 60.5 in | Wt 148.0 lb

## 2019-12-23 DIAGNOSIS — I10 Essential (primary) hypertension: Secondary | ICD-10-CM | POA: Diagnosis not present

## 2019-12-23 DIAGNOSIS — E119 Type 2 diabetes mellitus without complications: Secondary | ICD-10-CM | POA: Diagnosis not present

## 2019-12-23 DIAGNOSIS — Z0000 Encounter for general adult medical examination without abnormal findings: Secondary | ICD-10-CM

## 2019-12-23 DIAGNOSIS — E559 Vitamin D deficiency, unspecified: Secondary | ICD-10-CM | POA: Diagnosis not present

## 2019-12-23 DIAGNOSIS — Z23 Encounter for immunization: Secondary | ICD-10-CM

## 2019-12-23 MED ORDER — AMLODIPINE BESYLATE 5 MG PO TABS: 5.0000 mg | ORAL_TABLET | Freq: Every day | ORAL | 2 refills | Status: DC

## 2019-12-23 NOTE — Progress Notes (Signed)
Subjective:    Patient ID: Claire Drake, female    DOB: 11/10/1940, 79 y.o.   MRN: [id number]  HPI Chief Complaint  Patient presents with   Annual Exam   This is an 79 yo female who presents today for annual exam.  Has overall been doing well.  Had labs done earlier.  Last CPE- several years Mammo- 12/08/2017, no longer screening Pap- follows up with gyn every 6 months Flu- annual Covid 19 vaccine-vaccinated Eye- annual Dental- regular Exercise- walking, gardening  Hypertension-currently on lisinopril 20 mg daily.  She brings in her blood pressure today and over the last 6 months they have ranged from 137-166/74-90.  Heart rate mostly in the 60s.  Diabetes mellitus type 2-currently taking Glucophage 500 mg once a day.  Does not check sugars at home.  No side effects from medication.  Review of Systems  Constitutional: Negative.   HENT: Negative.   Eyes: Negative.   Respiratory: Negative.   Cardiovascular: Negative.   Gastrointestinal: Negative.   Endocrine: Negative.   Genitourinary: Negative.   Musculoskeletal: Negative.   Skin: Positive for wound (from rose bush, right shin, left forearm).  Allergic/Immunologic: Negative.   Neurological: Negative.   Hematological: Negative.   Psychiatric/Behavioral: Negative.        Objective:   Physical Exam Physical Exam  Constitutional: She is oriented to person, place, and time. She appears well-developed and well-nourished. No distress.  HENT:  Head: Normocephalic and atraumatic.  Right Ear: External ear normal. TM normal.  Left Ear: External ear normal. TM normal.  Nose: Nose normal.  Mouth/Throat: Oropharynx is clear and moist. No oropharyngeal exudate.  Eyes: Conjunctivae are normal.   Neck: Normal range of motion. Neck supple. No JVD present. No thyromegaly present.  Cardiovascular: Normal rate, regular rhythm, normal heart sounds and intact distal pulses.   Pulmonary/Chest: Effort normal and breath sounds normal.  Right breast exhibits no inverted nipple, no mass, no nipple discharge, no skin change and no tenderness. Left breast exhibits no inverted nipple, no mass, no nipple discharge, no skin change and no tenderness. Breasts are symmetrical.  Abdominal: Soft. Bowel sounds are normal. She exhibits no distension and no mass. There is no tenderness. There is no rebound and no guarding.  Musculoskeletal: Normal range of motion. She exhibits no edema or tenderness.  Lymphadenopathy:    She has no cervical adenopathy.  Neurological: She is alert and oriented to person, place, and time.   Skin: Skin is warm and dry. She is not diaphoretic.  Psychiatric: She has a normal mood and affect. Her behavior is normal. Judgment and thought content normal.  Vitals reviewed.     BP (!) 146/82    Pulse 78    Temp 98.6 F (37 C)    Ht 5' 0.5" (1.537 m)    Wt 148 lb (67.1 kg)    SpO2 98%    BMI 28.43 kg/m  Wt Readings from Last 3 Encounters:  12/23/19 148 lb (67.1 kg)  07/05/19 153 lb (69.4 kg)  06/22/19 154 lb (69.9 kg)       Assessment & Plan:  1. Annual physical exam - Discussed and encouraged healthy lifestyle choices- adequate sleep, regular exercise, stress management and healthy food choices.    2. Essential hypertension -Blood pressure not at goal.  Continue lisinopril and add amlodipine 5 mg.  She will check her blood pressure 1-2 times a week, keep a log and follow-up in 4 weeks - amLODipine (NORVASC) 5 MG  tablet; Take 1 tablet (5 mg total) by mouth daily.  Dispense: 30 tablet; Refill: 2  3. Vitamin D deficiency -Improved.  She will finish her high dose weekly supplement and then go to daily vitamin D3 supplementation 1000 international units  4. Controlled type 2 diabetes mellitus without complication, without long-term current use of insulin (HCC) -Continued improvement of hemoglobin A1c with most recent reading last week of 6.1.  LDL cholesterol at goal of 68.  Continue current meds.  5. Need  for Tdap vaccination -Encouraged her to have this administered at her pharmacy  This visit occurred during the SARS-CoV-2 public health emergency.  Safety protocols were in place, including screening questions prior to the visit, additional usage of staff PPE, and extensive cleaning of exam room while observing appropriate contact time as indicated for disinfecting solutions.      Clarene Reamer, FNP-BC  [facility] at [facility], [facility]  12/23/2019 5:13 PM

## 2019-12-23 NOTE — Patient Instructions (Signed)
Get tetanus vaccine at pharmacy  Finish high dose vitamin D replacement and then start daily vitamin D3 1,000 IU  I have sent in a new blood pressure medication, amlodipine, take one tablet daily, ok to take with other medications and check blood pressure 1-2 times per week, follow up in 4 weeks, bring your blood pressure log

## 2020-01-07 ENCOUNTER — Other Ambulatory Visit: Payer: Self-pay | Admitting: Family Medicine

## 2020-01-07 DIAGNOSIS — M810 Age-related osteoporosis without current pathological fracture: Secondary | ICD-10-CM

## 2020-01-13 ENCOUNTER — Telehealth: Payer: Self-pay | Admitting: Obstetrics and Gynecology

## 2020-01-13 ENCOUNTER — Telehealth: Payer: Self-pay | Admitting: *Deleted

## 2020-01-13 NOTE — Telephone Encounter (Signed)
Patient would like appointment changed from 9/9/20121 to 02/08/2020. Please call if OK.

## 2020-01-13 NOTE — Telephone Encounter (Signed)
Writer was asked to phone patient and move 01-18-20 appt to 02-08-20. Writer spoke with patient and appt has been moved to 02-08-20.

## 2020-01-18 ENCOUNTER — Inpatient Hospital Stay: Payer: Medicare PPO

## 2020-01-23 ENCOUNTER — Other Ambulatory Visit: Payer: Self-pay

## 2020-01-23 ENCOUNTER — Ambulatory Visit: Payer: Medicare PPO | Admitting: Family Medicine

## 2020-01-23 ENCOUNTER — Encounter: Payer: Self-pay | Admitting: Family Medicine

## 2020-01-23 VITALS — BP 138/78 | HR 69 | Temp 97.4°F | Ht 65.0 in | Wt 146.2 lb

## 2020-01-23 DIAGNOSIS — I10 Essential (primary) hypertension: Secondary | ICD-10-CM | POA: Diagnosis not present

## 2020-01-23 DIAGNOSIS — T148XXA Other injury of unspecified body region, initial encounter: Secondary | ICD-10-CM | POA: Diagnosis not present

## 2020-01-23 MED ORDER — AMLODIPINE BESYLATE 5 MG PO TABS: 5.0000 mg | ORAL_TABLET | Freq: Every day | ORAL | 3 refills | Status: DC

## 2020-01-23 NOTE — Patient Instructions (Signed)
Good to see you today  Please bring your blood pressure cuff with you to your next appointment  Your blood pressure looks good today  Follow up in 6 months

## 2020-01-23 NOTE — Progress Notes (Signed)
   Subjective:    Patient ID: Claire Drake, female    DOB: 10-12-1940, 79 y.o.   MRN: [id number]  HPI  Chief Complaint  Patient presents with  . Hypertension    FOLLOW UP, scheduling eye exam within next few weeks.   This is an 79 yo female who presents today for follow up of BP. History of essential htn, not at goal last month. Amlodipine 5 mg was added to lisinopril. She denies any side effects of medication.  She brings home readings today. Ranges from 137-164/86-90.   New bruising on left leg behind, below knee. Just noticed today, not sure if she bumped her leg yesterday.    Review of Systems Denies headache, visual changes, chest pain, SOB, leg swelling.     Objective:   Physical Exam Physical Exam  Constitutional: Oriented to person, place, and time. Appears well-developed and well-nourished.  HENT:  Head: Normocephalic and atraumatic.  Eyes: Conjunctivae are normal.  Neck: Normal range of motion. Neck supple.  Cardiovascular: Normal rate, regular rhythm and normal heart sounds.   Pulmonary/Chest: Effort normal and breath sounds normal.  Musculoskeletal: No lower extremity edema.   Neurological: Alert and oriented to person, place, and time.  Skin: Skin is warm and dry. Left leg with some scattered bruising, slight swelling, medially, below knee.  Psychiatric: Normal mood and affect. Behavior is normal. Judgment and thought content normal.  Vitals reviewed.     BP 138/78   Pulse 69   Temp (!) 97.4 F (36.3 C) (Temporal)   Ht 5\' 5"  (1.651 m)   Wt 146 lb 4 oz (66.3 kg)   SpO2 97%   BMI 24.34 kg/m  Wt Readings from Last 3 Encounters:  01/23/20 146 lb 4 oz (66.3 kg)  12/23/19 148 lb (67.1 kg)  07/05/19 153 lb (69.4 kg)   BP Readings from Last 3 Encounters:  01/23/20 138/78  12/23/19 (!) 146/82  07/05/19 (!) 185/85       Assessment & Plan:  1. Essential hypertension - improved in office, unsure of accuracy of home cuff - has upcoming appointment with  oncology, will monitor reading - amLODipine (NORVASC) 5 MG tablet; Take 1 tablet (5 mg total) by mouth daily.  Dispense: 90 tablet; Refill: 3 - follow up in 6 months  2. Bruising - unsure if she hit area on a chair at a party yesterday, she will continue to monitor and let me know if doesn't clear or if she has any other bruising   This visit occurred during the SARS-CoV-2 public health emergency.  Safety protocols were in place, including screening questions prior to the visit, additional usage of staff PPE, and extensive cleaning of exam room while observing appropriate contact time as indicated for disinfecting solutions.      Clarene Reamer, FNP-BC  [facility] at [facility], [facility]  01/23/2020 11:37 AM

## 2020-02-01 ENCOUNTER — Other Ambulatory Visit: Payer: Self-pay | Admitting: Family Medicine

## 2020-02-01 DIAGNOSIS — E7841 Elevated Lipoprotein(a): Secondary | ICD-10-CM

## 2020-02-01 DIAGNOSIS — E119 Type 2 diabetes mellitus without complications: Secondary | ICD-10-CM

## 2020-02-08 ENCOUNTER — Other Ambulatory Visit: Payer: Self-pay

## 2020-02-08 ENCOUNTER — Encounter: Payer: Self-pay | Admitting: Obstetrics and Gynecology

## 2020-02-08 ENCOUNTER — Inpatient Hospital Stay: Payer: Medicare PPO | Attending: Obstetrics and Gynecology | Admitting: Obstetrics and Gynecology

## 2020-02-08 VITALS — BP 168/82 | HR 77 | Temp 98.5°F | Resp 16 | Wt 148.3 lb

## 2020-02-08 DIAGNOSIS — Z90722 Acquired absence of ovaries, bilateral: Secondary | ICD-10-CM | POA: Insufficient documentation

## 2020-02-08 DIAGNOSIS — E559 Vitamin D deficiency, unspecified: Secondary | ICD-10-CM | POA: Diagnosis not present

## 2020-02-08 DIAGNOSIS — Z7982 Long term (current) use of aspirin: Secondary | ICD-10-CM | POA: Diagnosis not present

## 2020-02-08 DIAGNOSIS — Z08 Encounter for follow-up examination after completed treatment for malignant neoplasm: Secondary | ICD-10-CM | POA: Diagnosis not present

## 2020-02-08 DIAGNOSIS — E1165 Type 2 diabetes mellitus with hyperglycemia: Secondary | ICD-10-CM | POA: Diagnosis not present

## 2020-02-08 DIAGNOSIS — I4891 Unspecified atrial fibrillation: Secondary | ICD-10-CM | POA: Diagnosis not present

## 2020-02-08 DIAGNOSIS — Z79899 Other long term (current) drug therapy: Secondary | ICD-10-CM | POA: Diagnosis not present

## 2020-02-08 DIAGNOSIS — Z791 Long term (current) use of non-steroidal anti-inflammatories (NSAID): Secondary | ICD-10-CM | POA: Diagnosis not present

## 2020-02-08 DIAGNOSIS — Z7984 Long term (current) use of oral hypoglycemic drugs: Secondary | ICD-10-CM | POA: Diagnosis not present

## 2020-02-08 DIAGNOSIS — Z90710 Acquired absence of both cervix and uterus: Secondary | ICD-10-CM | POA: Insufficient documentation

## 2020-02-08 DIAGNOSIS — M1990 Unspecified osteoarthritis, unspecified site: Secondary | ICD-10-CM | POA: Diagnosis not present

## 2020-02-08 DIAGNOSIS — Z8542 Personal history of malignant neoplasm of other parts of uterus: Secondary | ICD-10-CM | POA: Diagnosis not present

## 2020-02-08 DIAGNOSIS — I10 Essential (primary) hypertension: Secondary | ICD-10-CM | POA: Diagnosis not present

## 2020-02-08 DIAGNOSIS — N816 Rectocele: Secondary | ICD-10-CM | POA: Diagnosis not present

## 2020-02-08 DIAGNOSIS — K219 Gastro-esophageal reflux disease without esophagitis: Secondary | ICD-10-CM | POA: Diagnosis not present

## 2020-02-08 NOTE — Progress Notes (Signed)
Gynecologic Oncology Interval Visit   Referring Provider: Dr. Ilda Basset  Chief Complaint: Endometroid adenocarcinoma, grade 1, surveillance  Subjective:  Claire Drake is a 79 y.o. [id number], post-menopausal, female with stage 1, grade 1 endometrial cancer seen in consultation from Clarene Reamer, FNP and Dr. Ilda Basset (Center for women[facility] care at [facility]), who returns to clinic for continued surveillance.   She underwent TLH, BSO, SLN mapping and biopsies on 09/09/2017. She returns to clinic today for continued surveillance.    We previously discussed rotating visits with Dr. Ilda Basset. She has not yet seen him. She was last seen by Dr. Fransisca Connors on 01/20/2018.   Gynecologic Oncology History  Claire Drake is a [id number], post-menopausal, female with who presented with grade 1 endometrial cancer seen in consultation from Clarene Reamer, [place] and Dr. Ilda Basset [facility] for women's health care at [facility]).Please see prior notes for complete details.   She underwent TLH, BSO, SLN mapping and biopsies on 09/09/2017.   Pathology  DIAGNOSIS:  A. UTERUS WITH CERVIX; HYSTERECTOMY:  - ENDOMETRIOID ADENOCARCINOMA WITH MUCINOUS FEATURES, FIGO GRADE 1.  - NEGATIVE FOR MYOMETRIAL INVASION, CERVICAL INVOLVEMENT, AND SEROSAL  INVOLVEMENT.  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  - INCIDENTAL 3 MM BENIGN INTRAMURAL MESENCHYMAL NODULE; ENDOMETRIAL  STROMAL NODULE VERSUS CELLULAR LEIOMYOMA.   BILATERAL FALLOPIAN TUBES AND OVARIES; SALPINGO-OOPHORECTOMY:  - NEGATIVE FOR MALIGNANCY.  - ATROPHY.   B. SENTINEL LYMPH NODE, LEFT EXTERNAL ILIAC; EXCISION:  - NEGATIVE FOR MALIGNANCY, ONE LYMPH NODE (0/1).   C. SENTINEL LYMPH NODE, RIGHT EXTERNAL ILIAC #1; EXCISION:  - NEGATIVE FOR MALIGNANCY, ONE LYMPH NODE (0/1).   D. SENTINEL LYMPH NODE, RIGHT EXTERNAL ILIAC #2; EXCISION:  - NEGATIVE FOR MALIGNANCY, TWO LYMPH NODES (0/2).   E. LYMPH NODE, LEFT PELVIC; EXCISION:  - NEGATIVE FOR  MALIGNANCY, ONE LYMPH NODE (0/1).      MLH1: Intact nuclear expression  MSH2: Intact nuclear expression  MSH6: Intact nuclear expression  PMS2: Intact nuclear expression   Cytology negative     Problem List: Patient Active Problem List   Diagnosis Date Noted  . Essential hypertension 05/16/2019  . Age-related osteoporosis without current pathological fracture 03/26/2018  . Vitamin D deficiency 03/26/2018  . Irregular heart rhythm 08/28/2017  . Uterine cancer ([place]) 08/18/2017  . Vaginal discharge 08/13/2017  . Transient hypertension 08/13/2017  . Uncontrolled type 2 diabetes mellitus with hyperglycemia ([place]) 08/06/2017  . BCC (basal cell carcinoma), face 08/02/2013    Past Medical History: Past Medical History:  Diagnosis Date  . Arthritis   . Cancer ([place]) 08/2017   endometrial cancer; skin cancer on lip and one on head  . Dysrhythmia    seeing Dr. Rockey Situ 09/07/2017  . GERD (gastroesophageal reflux disease)    occasionally  . Hypertension 08/2017   just recently with new diagnosis  . Rectocele 2019  . Uncontrolled type 2 diabetes mellitus with hyperglycemia ([place]) 08/06/2017   A1C 8.7  07/2017    Past Surgical History: Past Surgical History:  Procedure Laterality Date  . ABDOMINAL HYSTERECTOMY    . APPENDECTOMY  1970  . CATARACT EXTRACTION, BILATERAL  05/12/2002  . CHOLECYSTECTOMY  1978  . LAPAROSCOPIC HYSTERECTOMY N/A 09/09/2017   Procedure: HYSTERECTOMY TOTAL LAPAROSCOPIC WITH REMOVAL OF OVARIES AND TUBES, SENTINEL LYMPH NODE MAPPING AND BIOPSIES;  Surgeon: Mellody Drown, MD;  Location: [facility] ORS;  Service: Gynecology;  Laterality: N/A;  . TUBAL LIGATION Bilateral 1980   Past Gynecologic History:  G3P2. Denies abnormal pap smears.  Tubal ligation in 1980s. Not currently sexually active. Denies history of STIs. Denies HRT use.   OB History:  OB History  Gravida Para Term Preterm AB Living  $Remov'3 2 2   1 2  'GjZmFb$ SAB TAB Ectopic Multiple Live Births  1       2    #  Outcome Date GA Lbr Len/2nd Weight Sex Delivery Anes PTL Lv  3 SAB           2 Term           1 Term             Obstetric Comments  svd x 2. sab x 1    Family History: Family History  Problem Relation Age of Onset  . Dementia Mother   . Heart disease Father   . Heart attack Father   . Diabetes Maternal Grandmother   . Heart attack Maternal Grandfather   . Breast cancer Neg Hx     Social History: Social History   Socioeconomic History  . Marital status: Widowed    Spouse name: Not on file  . Number of children: Not on file  . Years of education: Not on file  . Highest education level: Not on file  Occupational History  . Not on file  Tobacco Use  . Smoking status: Never Smoker  . Smokeless tobacco: Never Used  Vaping Use  . Vaping Use: Never used  Substance and Sexual Activity  . Alcohol use: No  . Drug use: No  . Sexual activity: Never  Other Topics Concern  . Not on file  Social History Narrative  . Not on file   Social Determinants of Health   Financial Resource Strain: Low Risk   . Difficulty of Paying Living Expenses: Not hard at all  Food Insecurity: No Food Insecurity  . Worried About Charity fundraiser in the Last Year: Never true  . Ran Out of Food in the Last Year: Never true  Transportation Needs: No Transportation Needs  . Lack of Transportation (Medical): No  . Lack of Transportation (Non-Medical): No  Physical Activity: Sufficiently Active  . Days of Exercise per Week: 7 days  . Minutes of Exercise per Session: 30 min  Stress: No Stress Concern Present  . Feeling of Stress : Not at all  Social Connections:   . Frequency of Communication with Friends and Family: Not on file  . Frequency of Social Gatherings with Friends and Family: Not on file  . Attends Religious Services: Not on file  . Active Member of Clubs or Organizations: Not on file  . Attends Archivist Meetings: Not on file  . Marital Status: Not on file  Intimate  Partner Violence: Not At Risk  . Fear of Current or Ex-Partner: No  . Emotionally Abused: No  . Physically Abused: No  . Sexually Abused: No    Allergies: Allergies  Allergen Reactions  . Seasonal Ic [Cholestatin] Other (See Comments)    Stuffy, nasal drainage    Current Medications: Current Outpatient Medications  Medication Sig Dispense Refill  . alendronate (FOSAMAX) 70 MG tablet TAKE 1 TABLET EVERY 7 DAYS WITH A FULL GLASS OF WATER ON AN EMPTY STOMACH DO NOT LIE DOWN FOR AT LEAST 30 MIN 12 tablet 3  . amLODipine (NORVASC) 5 MG tablet Take 1 tablet (5 mg total) by mouth daily. 90 tablet 3  . aspirin EC 81 MG tablet Take 81 mg by mouth daily.    Marland Kitchen  atorvastatin (LIPITOR) 10 MG tablet TAKE 1 TABLET BY MOUTH DAILY 90 tablet 3  . diphenhydrAMINE (BENADRYL) 25 mg capsule Take 25 mg by mouth at bedtime as needed for allergies or sleep.    Marland Kitchen ibuprofen (ADVIL,MOTRIN) 600 MG tablet Take 1 tablet (600 mg total) by mouth every 6 (six) hours. 65 tablet 1  . lisinopril (ZESTRIL) 20 MG tablet TAKE ONE TABLET BY MOUTH EVERY DAY 90 tablet 3  . metFORMIN (GLUCOPHAGE) 500 MG tablet TAKE ONE TABLET BY MOUTH DAILY WITH SUPPER. KEEP APPOINTMENT IN AUGUST FOR FOLLOW UP. 90 tablet 2  . naproxen sodium (ALEVE) 220 MG tablet Take 220-440 mg by mouth daily as needed (pain).    . Vitamin D, Ergocalciferol, (DRISDOL) 1.25 MG (50000 UNIT) CAPS capsule TAKE 1 CAPSULE BY MOUTH EVERY 7 DAYS 12 capsule 1  . calcium elemental as carbonate (TUMS ULTRA 1000) 400 MG chewable tablet Chew 1,000 mg by mouth daily as needed for heartburn. (Patient not taking: Reported on 01/23/2020)     No current facility-administered medications for this visit.   Review of Systems General:  no complaints Skin: no complaints Eyes: no complaints HEENT: no complaints Breasts: no complaints Pulmonary: no complaints Cardiac: no complaints Gastrointestinal: no complaints Genitourinary/Sexual: no complaints Ob/Gyn: no  complaints Musculoskeletal: no complaints Hematology: no complaints Neurologic/Psych: no complaints   Objective:  Physical Examination:  BP (!) 168/82   Pulse 77   Temp 98.5 F (36.9 C) (Oral)   Resp 16   Wt 148 lb 4.8 oz (67.3 kg)   SpO2 100%   BMI 24.68 kg/m     ECOG Performance Status: 0 - Asymptomatic   GENERAL: Patient is a well appearing female in no acute distress HEENT:  Sclera clear. Anicteric NODES:  Negative axillary, supraclavicular, inguinal lymph node survery LUNGS:  Clear to auscultation bilaterally.   HEART:  Regular rate and rhythm.  ABDOMEN:  Soft, nontender.  No hernias, incisions well healed. No masses or ascites EXTREMITIES:  No peripheral edema. Atraumatic. No cyanosis SKIN:  Clear with no obvious rashes or skin changes.  NEURO:  Nonfocal. Well oriented.  Appropriate affect.  Pelvic: Exam chaperoned by RN: EGBUS/Vagina: large rectocoele, Cervix/Uterus surgically absent. Bimanual no masses.    Assessment:  Claire Drake is a 79 y.o. 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 NED.  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 NED  Medical co-morbidities complicating care: Atrial fibrillation, T2DM   Plan:   Problem List Items Addressed This Visit    None    Visit Diagnoses    History of endometrial cancer    -  Primary     Risk of recurrence is <= 5%. No adjuvant therapy prescribed.  She will alternate follow up with Dr Ilda Basset q 6 months.  She will see him in 6 months and [place] in a year.  Can return sooner if any concerning symptoms arise.  After next visit she probably can do all her follow up with Dr Ilda Basset.   Beckey Rutter, DNP, AGNP-C [place] at [facility] [phone] (clinic)  I personally interviewed and examined the patient. Agreed with the above/below plan of care. I have directly contributed to assessment and plan of care of this patient and educated and  discussed with patient and family.  Mellody Drown, MD  CC:  Referring Provider: Dr. Ilda Basset

## 2020-02-08 NOTE — Patient Instructions (Signed)
Follow up with Dr. Ilda Basset in 6 months

## 2020-04-30 ENCOUNTER — Encounter: Payer: Self-pay | Admitting: Radiology

## 2020-04-30 DIAGNOSIS — X32XXXA Exposure to sunlight, initial encounter: Secondary | ICD-10-CM | POA: Diagnosis not present

## 2020-04-30 DIAGNOSIS — L570 Actinic keratosis: Secondary | ICD-10-CM | POA: Diagnosis not present

## 2020-06-18 ENCOUNTER — Ambulatory Visit (INDEPENDENT_AMBULATORY_CARE_PROVIDER_SITE_OTHER): Payer: Medicare PPO | Admitting: Obstetrics and Gynecology

## 2020-06-18 ENCOUNTER — Other Ambulatory Visit: Payer: Self-pay

## 2020-06-18 ENCOUNTER — Encounter: Payer: Self-pay | Admitting: Obstetrics and Gynecology

## 2020-06-18 VITALS — BP 156/83 | HR 69 | Wt 149.0 lb

## 2020-06-18 DIAGNOSIS — K6289 Other specified diseases of anus and rectum: Secondary | ICD-10-CM | POA: Diagnosis not present

## 2020-06-18 DIAGNOSIS — Z8542 Personal history of malignant neoplasm of other parts of uterus: Secondary | ICD-10-CM | POA: Diagnosis not present

## 2020-06-18 MED ORDER — HYDROCORTISONE 1 % EX OINT: 1.0000 "application " | TOPICAL_OINTMENT | Freq: Every day | CUTANEOUS | 0 refills | Status: AC

## 2020-06-18 NOTE — Patient Instructions (Addendum)
Avoid vulvar irritants and allergens.    Use mild soaps, but avoid use on the vulva.    Cleanse the vulva with water only.    Gently pat the vulva dry after bathing.    Apply preservative-free, unscented or fragrance-free emollient to hold moisture in the skin and improve barrier function.    Use PeriCare bottle to rinse after urination.    Use 100% cotton, unscented or fragrance-free menstrual pads.    Use adequate lubrication for intercourse (unscented or fragrance-free silicone-based lubricants recommended).

## 2020-06-18 NOTE — Progress Notes (Signed)
Obstetrics and Gynecology Visit Return Patient Evaluation  Appointment Date: 06/18/2020  Primary Care Provider: [place], [place] for [facility]  Chief Complaint: pelvic exam. Follow up endometrial cancer.   History of Present Illness:  Claire Drake is a 80 y.o. s/p 09/09/2017 TLH/BSO and bilateral sentinel pelvic LN sampling. 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 Patient's last exam was in 01/2020 by Gyn Onc (no pap done); it was negative and plans were for q35m alternating pelvic exams with me and them.   Interval History: Since that time, she states that she is doing well and no problems or complaints, in particular no VB or spotting, vaginal discharge, pelvic pain.   Review of Systems: as noted in the History of Present Illness.  Patient Active Problem List   Diagnosis Date Noted  . Essential hypertension 05/16/2019  . Age-related osteoporosis without current pathological fracture 03/26/2018  . Vitamin D deficiency 03/26/2018  . Irregular heart rhythm 08/28/2017  . Uterine cancer ([place]) 08/18/2017  . Vaginal discharge 08/13/2017  . Transient hypertension 08/13/2017  . Uncontrolled type 2 diabetes mellitus with hyperglycemia ([place]) 08/06/2017  . BCC (basal cell carcinoma), face 08/02/2013   Medications:  [name] had no medications administered during this visit. Current Outpatient Medications  Medication Sig Dispense Refill  . alendronate (FOSAMAX) 70 MG tablet TAKE 1 TABLET EVERY 7 DAYS WITH A FULL GLASS OF WATER ON AN EMPTY STOMACH DO NOT LIE DOWN FOR AT LEAST 30 MIN 12 tablet 3  . amLODipine (NORVASC) 5 MG tablet Take 1 tablet (5 mg total) by mouth daily. 90 tablet 3  . aspirin EC 81 MG tablet Take 81 mg by mouth daily.    Marland Kitchen atorvastatin (LIPITOR) 10 MG tablet TAKE 1 TABLET BY MOUTH DAILY 90 tablet 3  . calcium elemental as  carbonate (BARIATRIC TUMS ULTRA) 400 MG chewable tablet Chew 1,000 mg by mouth daily as needed for heartburn.    . diphenhydrAMINE (BENADRYL) 25 mg capsule Take 25 mg by mouth at bedtime as needed for allergies or sleep.    . hydrocortisone 1 % ointment Apply 1 application topically daily. 45 g 0  . ibuprofen (ADVIL,MOTRIN) 600 MG tablet Take 1 tablet (600 mg total) by mouth every 6 (six) hours. 65 tablet 1  . lisinopril (ZESTRIL) 20 MG tablet TAKE ONE TABLET BY MOUTH EVERY DAY 90 tablet 3  . metFORMIN (GLUCOPHAGE) 500 MG tablet TAKE ONE TABLET BY MOUTH DAILY WITH SUPPER. KEEP APPOINTMENT IN AUGUST FOR FOLLOW UP. 90 tablet 2  . naproxen sodium (ALEVE) 220 MG tablet Take 220-440 mg by mouth daily as needed (pain).    . Vitamin D, Ergocalciferol, (DRISDOL) 1.25 MG (50000 UNIT) CAPS capsule TAKE 1 CAPSULE BY MOUTH EVERY 7 DAYS 12 capsule 1   No current facility-administered medications for this visit.    Allergies: is allergic to seasonal ic [cholestatin].  Physical Exam:  BP (!) 156/83   Pulse 69   Wt 149 lb (67.6 kg)   BMI 24.79 kg/m  Body mass index is 24.79 kg/m. General appearance: Well nourished, well developed female in no acute distress.  Abdomen: diffusely non tender to palpation, non distended, and no masses, hernias Neuro/Psych:  Normal mood and affect.    Pelvic exam:  EGBUS: normal except starting at the posterior fourchette. Starting at the posterior fourchette, she has mild erythema (  bilaterally) in a diamond shape distribution, up to 4cm wide and narrowing to the rectum Vagina: normal, moderate atrophy. -2 anterior vault and cuff prolapse Cuff: normal, nttp Bimanual: negative RV: deferred    Assessment: pt stable  Plan:  1. History of endometrial cancer No e/o disease. Plan for September 2022 Gyn Onc exam  2. Perirectal skin irritation On questioning, patient denies any vaginal itching. She does note some irritation recently at the area but states this is due to  using synthetic material for her underwear vs cotton. Behavioral interventions given and I ordered qhs hydrocortisone ointment and plan to repeat exam in 4-6 weeks. If no improvement, plan to biopsy.    RTC: 4-6 weeks for repeat pelvic exam and possible skin biopsy.   Durene Romans MD Attending Center for [employer])

## 2020-07-23 ENCOUNTER — Ambulatory Visit: Payer: Medicare PPO | Admitting: Family Medicine

## 2020-07-23 ENCOUNTER — Other Ambulatory Visit: Payer: Self-pay | Admitting: Obstetrics and Gynecology

## 2020-07-23 ENCOUNTER — Ambulatory Visit (INDEPENDENT_AMBULATORY_CARE_PROVIDER_SITE_OTHER): Payer: Medicare PPO | Admitting: Obstetrics and Gynecology

## 2020-07-23 ENCOUNTER — Other Ambulatory Visit: Payer: Self-pay

## 2020-07-23 ENCOUNTER — Encounter: Payer: Self-pay | Admitting: Obstetrics and Gynecology

## 2020-07-23 VITALS — BP 164/83 | HR 80 | Ht 60.5 in | Wt 149.0 lb

## 2020-07-23 DIAGNOSIS — L292 Pruritus vulvae: Secondary | ICD-10-CM | POA: Diagnosis not present

## 2020-07-23 HISTORY — PX: PUNCH BIOPSY OF SKIN: SHX6390

## 2020-07-23 MED ORDER — CLOBETASOL PROPIONATE 0.05 % EX OINT: TOPICAL_OINTMENT | CUTANEOUS | 2 refills | Status: DC

## 2020-07-23 NOTE — Procedures (Signed)
Punch Biopsy Procedure Note  Pre-operative Diagnosis: Persistent anogenital pruritis unresponsive to OTC hydrocortisone cream  Post-operative Diagnosis: same  Indications: patient with stable itching. She doesn't believe the OTC ointment is helping any. No bleeding.   Procedure Details  Written consent was obtained.   The patient was placed in the dorsal lithotomy position.  External genitalia normal except starting at the posterior fourchette. Starting at the posterior fourchette, she has mild erythema (bilaterally) in a diamond shape distribution, up to 4cm wide and narrowing to the rectum.  A spot on the right side on the edge about 1.5cm below the gluteal fold was chosen. The area was cleaned with alcohol and betadine and then injected with 20mL of 1% lidocaine with epinephrine. The area was then cleaned again with more betadine and a 31mm punch biopsy was done trying to incorporate normal and affected skin. This was cut and sent to pathology.  Silver nitrate applied to the area and gauze and a bandage applied.   Condition: Stable  Complications: None  Plan: Post biopsy instructions d/w pt.  Will do clobetasol 0.05% ointment qhs for now and follow up biopsy results  Durene Romans MD Attending Center for [facility])

## 2020-07-23 NOTE — Progress Notes (Signed)
Error

## 2020-07-23 NOTE — Progress Notes (Signed)
Patient states that she feels that her itching is about the same. Kathrene Alu RN

## 2020-07-27 ENCOUNTER — Other Ambulatory Visit: Payer: Self-pay

## 2020-07-27 DIAGNOSIS — E1165 Type 2 diabetes mellitus with hyperglycemia: Secondary | ICD-10-CM

## 2020-07-27 NOTE — Telephone Encounter (Signed)
Pharmacy requests refill on: Metformin 500 mg   LAST REFILL: 10/12/2019 (Q-90, R-2) LAST OV: 01/23/2020 NEXT OV: Not Scheduled  PHARMACY: [facility]  Hgb A1C (12/19/2019): 6.1

## 2020-07-29 MED ORDER — METFORMIN HCL 500 MG PO TABS: ORAL_TABLET | ORAL | 0 refills | Status: DC

## 2020-07-29 NOTE — Telephone Encounter (Signed)
Sent. Thanks.  Needs TOC OV re: DM2

## 2020-07-31 ENCOUNTER — Telehealth: Payer: Self-pay

## 2020-07-31 NOTE — Telephone Encounter (Signed)
-----   Message from Aletha Halim, MD sent at 07/31/2020 11:04 AM EDT ----- Can you let her know that I'll give her a call when I'm back from [facility] but the pathology didn't show any cancer, and can you ask her how she's doing with the new ointment that I started her on at the last visit? thanks

## 2020-07-31 NOTE — Telephone Encounter (Signed)
Pt called and given results. Pt verbalizes understanding and states the new ointment Dr. Ilda Basset prescribed is working great at this time.

## 2020-08-07 ENCOUNTER — Telehealth: Payer: Self-pay | Admitting: Obstetrics and Gynecology

## 2020-08-07 NOTE — Telephone Encounter (Signed)
GYN Telephone Note  Patient called and results d/w her and she is getting better on the clobetasol ointment. I told her to continue taking it as instructed and will see her back in about 80m for f/u exam. Pt is amenable to plan  Durene Romans MD Attending Center for [place] (Faculty Practice) 08/07/2020 Time: 1046am

## 2020-08-22 ENCOUNTER — Other Ambulatory Visit: Payer: Self-pay

## 2020-08-22 DIAGNOSIS — I10 Essential (primary) hypertension: Secondary | ICD-10-CM

## 2020-08-22 NOTE — Telephone Encounter (Signed)
Pharmacy requests refill on: Lisinopril 20 mg   LAST REFILL: 08/04/2019 (Q-90, R-3) LAST OV: 01/23/2020 NEXT OV: Not Scheduled  PHARMACY: [facility]

## 2020-08-23 MED ORDER — LISINOPRIL 20 MG PO TABS: 1.0000 | ORAL_TABLET | Freq: Every day | ORAL | 1 refills | Status: DC

## 2020-09-03 ENCOUNTER — Encounter: Payer: Self-pay | Admitting: Radiology

## 2020-10-18 ENCOUNTER — Other Ambulatory Visit: Payer: Self-pay

## 2020-10-18 ENCOUNTER — Ambulatory Visit (INDEPENDENT_AMBULATORY_CARE_PROVIDER_SITE_OTHER): Payer: Medicare PPO | Admitting: Obstetrics and Gynecology

## 2020-10-18 VITALS — BP 177/80 | HR 66 | Wt 150.0 lb

## 2020-10-18 DIAGNOSIS — B372 Candidiasis of skin and nail: Secondary | ICD-10-CM

## 2020-10-18 NOTE — Progress Notes (Signed)
Obstetrics and Gynecology Visit Return Patient Evaluation  Appointment Date: 10/18/2020  Primary Care Provider: Elby Beck (Inactive)  OBGYN Clinic: [facility]  Chief Complaint: follow up GU erythema  History of Present Illness:  Claire Drake is a 80 y.o. seen by me for surveillance pelvic exam on 2/7 for her h/o endometrial cancer and peri-rectal skin irritation and itching was noted and plan for hydrocortisone ointment and f/u in 4-6wks, which she did on 3/14 but s/s persisted. 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 She was put on 0.05% clobetasol and to come back in 3 months  Interval History: Since that time, she states that her s/s feel resolved and only thing she notes is an irritated spot that feels different on the right inguinal fold that started last night/today. She is using the clobetasol 2x/week and hasn't tried anything on the new spot.   Review of Systems: as noted in the History of Present Illness.  Patient Active Problem List   Diagnosis Date Noted   Vulvar pruritus 07/23/2020   Essential hypertension 05/16/2019   Age-related osteoporosis without current pathological fracture 03/26/2018   Vitamin D deficiency 03/26/2018   Irregular heart rhythm 08/28/2017   Uterine cancer ([place]) 08/18/2017   Vaginal discharge 08/13/2017   Transient hypertension 08/13/2017   Uncontrolled type 2 diabetes mellitus with hyperglycemia ([place]) 08/06/2017   BCC (basal cell carcinoma), face 08/02/2013   Medications:  Elizabeth Sauer. Covey had no medications administered during this visit. Current Outpatient Medications  Medication Sig Dispense Refill   alendronate (FOSAMAX) 70 MG tablet TAKE 1 TABLET EVERY 7 DAYS WITH A FULL GLASS OF WATER ON AN EMPTY STOMACH DO NOT LIE DOWN FOR AT LEAST 30 MIN 12 tablet 3   amLODipine (NORVASC) 5 MG tablet Take 1 tablet (5 mg total) by mouth daily. 90 tablet 3   aspirin EC 81 MG tablet Take 81 mg by mouth  daily.     atorvastatin (LIPITOR) 10 MG tablet TAKE 1 TABLET BY MOUTH DAILY 90 tablet 3   calcium elemental as carbonate (BARIATRIC TUMS ULTRA) 400 MG chewable tablet Chew 1,000 mg by mouth daily as needed for heartburn.     clobetasol ointment (TEMOVATE) 0.05 % Apply to affected area qhs x 4wks. Then every other night x 4wks and then 2x/wk x 4 wks. 45 g 2   diphenhydrAMINE (BENADRYL) 25 mg capsule Take 25 mg by mouth at bedtime as needed for allergies or sleep.     ibuprofen (ADVIL,MOTRIN) 600 MG tablet Take 1 tablet (600 mg total) by mouth every 6 (six) hours. 65 tablet 1   lisinopril (ZESTRIL) 20 MG tablet Take 1 tablet (20 mg total) by mouth daily. 90 tablet 1   metFORMIN (GLUCOPHAGE) 500 MG tablet TAKE ONE TABLET BY MOUTH DAILY WITH SUPPER 90 tablet 0   naproxen sodium (ALEVE) 220 MG tablet Take 220-440 mg by mouth daily as needed (pain).     Vitamin D, Ergocalciferol, (DRISDOL) 1.25 MG (50000 UNIT) CAPS capsule TAKE 1 CAPSULE BY MOUTH EVERY 7 DAYS 12 capsule 1   No current facility-administered medications for this visit.    Allergies: is allergic to seasonal ic [cholestatin].  Physical Exam:  BP (!) 177/80   Pulse 66   Wt 150 lb (68 kg)   BMI 28.81 kg/m  Body mass index is 28.81 kg/m. General appearance: Well nourished, well developed female in no acute distress.  Neuro/Psych:  Normal mood and affect.    Pelvic  exam:  Right inguinal fold with 4cm area in the fold that is erythematous, somewhat shiny c/w candida  EGBUS: normal 360, circumferential 2cm around the rectum is very mild erythema similar to what she had last time.     Assessment: pt doing much better  Plan: I told her that I recommend not doing anymore on the labia and just on the peri-rectal area. I suspect she wasn't area to apply that far down. If labia area still normal and and peri-rectal area normal when I see her back, she can stay off the clobetasol  Yeast infection: OTC anti-fungals and keeping the  area clean and dry.   RTC: 6 weeks  Durene Romans MD Attending Center for [facility])

## 2020-10-23 NOTE — Telephone Encounter (Signed)
Unable to contact patient, phone is out of service.

## 2020-11-05 ENCOUNTER — Other Ambulatory Visit: Payer: Self-pay | Admitting: Family Medicine

## 2020-11-05 DIAGNOSIS — E1165 Type 2 diabetes mellitus with hyperglycemia: Secondary | ICD-10-CM

## 2020-11-06 NOTE — Telephone Encounter (Signed)
Refill request for Metformin 500 mg tablets  LOV - 01/23/20 Next OV - not scheduled Last refill - 07/29/20 #90/0

## 2020-11-29 ENCOUNTER — Other Ambulatory Visit: Payer: Self-pay

## 2020-11-29 ENCOUNTER — Ambulatory Visit: Payer: Medicare PPO | Admitting: Obstetrics & Gynecology

## 2020-11-29 ENCOUNTER — Encounter: Payer: Self-pay | Admitting: Obstetrics & Gynecology

## 2020-11-29 VITALS — BP 167/83 | HR 73

## 2020-11-29 DIAGNOSIS — L280 Lichen simplex chronicus: Secondary | ICD-10-CM | POA: Diagnosis not present

## 2020-11-29 DIAGNOSIS — B372 Candidiasis of skin and nail: Secondary | ICD-10-CM | POA: Diagnosis not present

## 2020-11-29 NOTE — Progress Notes (Signed)
   GYNECOLOGY OFFICE VISIT NOTE  History:   Claire Drake is a 80 y.o. [id number] here today for follow up of treatment for intertriginous candidiasis and lichenoid dermatitis. Has been followed by Dr. Ilda Basset has been treated with Clobetasol cream and antifungal therapy.  Today, she reports that her symptoms have resolved, doing much better. She denies any abnormal vaginal discharge, bleeding, pelvic pain or other concerns.    Past Medical History:  Diagnosis Date   Arthritis    Cancer ([place]) 08/2017   endometrial cancer; skin cancer on lip and one on head   Dysrhythmia    seeing Dr. Rockey Situ 09/07/2017   GERD (gastroesophageal reflux disease)    occasionally   Hypertension 08/2017   just recently with new diagnosis   Rectocele 2019   Uncontrolled type 2 diabetes mellitus with hyperglycemia ([place]) 08/06/2017   A1C 8.7  07/2017    Past Surgical History:  Procedure Laterality Date   ABDOMINAL HYSTERECTOMY     APPENDECTOMY  1970   CATARACT EXTRACTION, BILATERAL  05/12/2002   CHOLECYSTECTOMY  1978   LAPAROSCOPIC HYSTERECTOMY N/A 09/09/2017   Procedure: HYSTERECTOMY TOTAL LAPAROSCOPIC WITH REMOVAL OF OVARIES AND TUBES, SENTINEL LYMPH NODE MAPPING AND BIOPSIES;  Surgeon: Mellody Drown, MD;  Location: [facility] ORS;  Service: Gynecology;  Laterality: N/A;   PUNCH BIOPSY OF SKIN  07/23/2020       TUBAL LIGATION Bilateral 1980    The following portions of the patient's history were reviewed and updated as appropriate: allergies, current medications, past family history, past medical history, past social history, past surgical history and problem list.   Review of Systems:  Pertinent items noted in HPI and remainder of comprehensive ROS otherwise negative.  Physical Exam:  BP (!) 167/83   Pulse 73  CONSTITUTIONAL: Well-developed, well-nourished female in no acute distress.  NEUROLOGIC: Alert and oriented to person, place, and time. Normal muscle tone coordination. No cranial nerve deficit  noted. PSYCHIATRIC: Normal mood and affect. Normal behavior. Normal judgment and thought content. CARDIOVASCULAR: Normal heart rate noted RESPIRATORY: Effort and breath sounds normal, no problems with respiration noted ABDOMEN: No masses noted. No other overt distention noted.   PELVIC: Atrophic external genitalia. No erythema visualized in inguinal folds, vulva and perirectal area     Assessment and Plan:     1. Intertriginous candidiasis 2. Lichenoid dermatitis Patient is doing much better, resolution of symptoms and has a reassuring exam. Continued proper vulvar hygiene recommended, can use OTC antifungals as needed for the candidiasis.  She was advised to stop the clobetasol for now, can reevaluate if vulvar or perirectal pruritus resumes.  Routine preventative health maintenance measures emphasized.  Return for any gynecologic concerns.    I spent 15 minutes dedicated to the care of this patient including pre-visit review of records, face to face time with the patient discussing her conditions and treatments.  Verita Schneiders, MD, [place] for [employer], [place]

## 2020-12-11 ENCOUNTER — Other Ambulatory Visit: Payer: Self-pay | Admitting: *Deleted

## 2020-12-11 DIAGNOSIS — M810 Age-related osteoporosis without current pathological fracture: Secondary | ICD-10-CM

## 2020-12-11 MED ORDER — ALENDRONATE SODIUM 70 MG PO TABS: ORAL_TABLET | ORAL | 0 refills | Status: DC

## 2020-12-11 NOTE — Telephone Encounter (Signed)
Last office visit 01/23/2020 with Glenda Chroman for HTN & Bruising.  Last refilled 12/23/2019 for #12 with 3 refills.  No TOC appointments.

## 2020-12-21 DIAGNOSIS — Z8542 Personal history of malignant neoplasm of other parts of uterus: Secondary | ICD-10-CM | POA: Diagnosis not present

## 2020-12-21 DIAGNOSIS — Z780 Asymptomatic menopausal state: Secondary | ICD-10-CM | POA: Diagnosis not present

## 2020-12-21 DIAGNOSIS — E559 Vitamin D deficiency, unspecified: Secondary | ICD-10-CM | POA: Diagnosis not present

## 2020-12-21 DIAGNOSIS — M810 Age-related osteoporosis without current pathological fracture: Secondary | ICD-10-CM | POA: Diagnosis not present

## 2020-12-21 DIAGNOSIS — R7303 Prediabetes: Secondary | ICD-10-CM | POA: Diagnosis not present

## 2020-12-21 DIAGNOSIS — I10 Essential (primary) hypertension: Secondary | ICD-10-CM | POA: Diagnosis not present

## 2020-12-21 DIAGNOSIS — Z1231 Encounter for screening mammogram for malignant neoplasm of breast: Secondary | ICD-10-CM | POA: Diagnosis not present

## 2020-12-21 DIAGNOSIS — Z0000 Encounter for general adult medical examination without abnormal findings: Secondary | ICD-10-CM | POA: Diagnosis not present

## 2020-12-25 ENCOUNTER — Other Ambulatory Visit: Payer: Self-pay | Admitting: Internal Medicine

## 2020-12-25 DIAGNOSIS — Z1231 Encounter for screening mammogram for malignant neoplasm of breast: Secondary | ICD-10-CM

## 2021-01-02 DIAGNOSIS — M810 Age-related osteoporosis without current pathological fracture: Secondary | ICD-10-CM | POA: Diagnosis not present

## 2021-01-15 DIAGNOSIS — L570 Actinic keratosis: Secondary | ICD-10-CM | POA: Diagnosis not present

## 2021-01-15 DIAGNOSIS — D225 Melanocytic nevi of trunk: Secondary | ICD-10-CM | POA: Diagnosis not present

## 2021-01-15 DIAGNOSIS — D2271 Melanocytic nevi of right lower limb, including hip: Secondary | ICD-10-CM | POA: Diagnosis not present

## 2021-01-15 DIAGNOSIS — X32XXXA Exposure to sunlight, initial encounter: Secondary | ICD-10-CM | POA: Diagnosis not present

## 2021-01-15 DIAGNOSIS — Z85828 Personal history of other malignant neoplasm of skin: Secondary | ICD-10-CM | POA: Diagnosis not present

## 2021-01-15 DIAGNOSIS — D2261 Melanocytic nevi of right upper limb, including shoulder: Secondary | ICD-10-CM | POA: Diagnosis not present

## 2021-01-15 DIAGNOSIS — D2262 Melanocytic nevi of left upper limb, including shoulder: Secondary | ICD-10-CM | POA: Diagnosis not present

## 2021-02-01 DIAGNOSIS — I10 Essential (primary) hypertension: Secondary | ICD-10-CM | POA: Diagnosis not present

## 2021-02-01 DIAGNOSIS — R7303 Prediabetes: Secondary | ICD-10-CM | POA: Diagnosis not present

## 2021-02-01 DIAGNOSIS — M810 Age-related osteoporosis without current pathological fracture: Secondary | ICD-10-CM | POA: Diagnosis not present

## 2021-02-01 DIAGNOSIS — Z23 Encounter for immunization: Secondary | ICD-10-CM | POA: Diagnosis not present

## 2021-02-06 ENCOUNTER — Inpatient Hospital Stay: Payer: Medicare PPO | Attending: Obstetrics and Gynecology | Admitting: Obstetrics and Gynecology

## 2021-02-06 VITALS — BP 153/67 | HR 70 | Temp 97.8°F | Resp 20 | Wt 145.3 lb

## 2021-02-06 DIAGNOSIS — Z90710 Acquired absence of both cervix and uterus: Secondary | ICD-10-CM | POA: Insufficient documentation

## 2021-02-06 DIAGNOSIS — Z8542 Personal history of malignant neoplasm of other parts of uterus: Secondary | ICD-10-CM | POA: Insufficient documentation

## 2021-02-06 DIAGNOSIS — Z90722 Acquired absence of ovaries, bilateral: Secondary | ICD-10-CM | POA: Diagnosis not present

## 2021-02-06 DIAGNOSIS — C55 Malignant neoplasm of uterus, part unspecified: Secondary | ICD-10-CM

## 2021-02-06 NOTE — Progress Notes (Signed)
Gynecologic Oncology Interval Visit   Referring Provider: Dr. Ilda Basset  Chief Complaint: Endometroid adenocarcinoma, grade 1, surveillance  Subjective:  Claire Drake is a 80 y.o. [id number], post-menopausal, female with stage 1, grade 1 endometrial cancer seen in consultation from Clarene Reamer, FNP and Dr. Ilda Basset ([facility] for [facility] at [facility]), who returns to clinic for continued surveillance.   She underwent TLH, BSO, SLN mapping and biopsies on 09/09/2017. She returns to clinic today for continued surveillance.  No complaints.  She has begun rotating with Dr. Ilda Basset for surveillance and has been seeing him for ongoing intertriginous candidiasis.   Gynecologic Oncology History Claire Drake is a [id number], post-menopausal, female with who presented with grade 1 endometrial cancer seen in consultation from Clarene Reamer, [place] and Dr. Ilda Basset [facility] for women's health care at [facility]).Please see prior notes for complete details.   She underwent TLH, BSO, SLN mapping and biopsies on 09/09/2017.   Pathology  DIAGNOSIS:  A. UTERUS WITH CERVIX; HYSTERECTOMY:  - ENDOMETRIOID ADENOCARCINOMA WITH MUCINOUS FEATURES, FIGO GRADE 1.  - NEGATIVE FOR MYOMETRIAL INVASION, CERVICAL INVOLVEMENT, AND SEROSAL  INVOLVEMENT.  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  - INCIDENTAL 3 MM BENIGN INTRAMURAL MESENCHYMAL NODULE; ENDOMETRIAL  STROMAL NODULE VERSUS CELLULAR LEIOMYOMA.   BILATERAL FALLOPIAN TUBES AND OVARIES; SALPINGO-OOPHORECTOMY:  - NEGATIVE FOR MALIGNANCY.  - ATROPHY.   B. SENTINEL LYMPH NODE, LEFT EXTERNAL ILIAC; EXCISION:  - NEGATIVE FOR MALIGNANCY, ONE LYMPH NODE (0/1).   C. SENTINEL LYMPH NODE, RIGHT EXTERNAL ILIAC #1; EXCISION:  - NEGATIVE FOR MALIGNANCY, ONE LYMPH NODE (0/1).   D. SENTINEL LYMPH NODE, RIGHT EXTERNAL ILIAC #2; EXCISION:  - NEGATIVE FOR MALIGNANCY, TWO LYMPH NODES (0/2).   E. LYMPH NODE, LEFT PELVIC; EXCISION:  - NEGATIVE FOR  MALIGNANCY, ONE LYMPH NODE (0/1).      MLH1: Intact nuclear expression  MSH2: Intact nuclear expression  MSH6: Intact nuclear expression  PMS2: Intact nuclear expression   Cytology negative    Problem List: Patient Active Problem List   Diagnosis Date Noted   Vulvar pruritus 07/23/2020   Essential hypertension 05/16/2019   Age-related osteoporosis without current pathological fracture 03/26/2018   Vitamin D deficiency 03/26/2018   Irregular heart rhythm 08/28/2017   Uterine cancer ([place]) 08/18/2017   Vaginal discharge 08/13/2017   Transient hypertension 08/13/2017   Uncontrolled type 2 diabetes mellitus with hyperglycemia ([place]) 08/06/2017   BCC (basal cell carcinoma), face 08/02/2013    Past Medical History: Past Medical History:  Diagnosis Date   Arthritis    Cancer ([place]) 08/2017   endometrial cancer; skin cancer on lip and one on head   Dysrhythmia    seeing Dr. Rockey Situ 09/07/2017   GERD (gastroesophageal reflux disease)    occasionally   Hypertension 08/2017   just recently with new diagnosis   Rectocele 2019   Uncontrolled type 2 diabetes mellitus with hyperglycemia ([place]) 08/06/2017   A1C 8.7  07/2017    Past Surgical History: Past Surgical History:  Procedure Laterality Date   ABDOMINAL HYSTERECTOMY     APPENDECTOMY  1970   CATARACT EXTRACTION, BILATERAL  05/12/2002   CHOLECYSTECTOMY  1978   LAPAROSCOPIC HYSTERECTOMY N/A 09/09/2017   Procedure: HYSTERECTOMY TOTAL LAPAROSCOPIC WITH REMOVAL OF OVARIES AND TUBES, SENTINEL LYMPH NODE MAPPING AND BIOPSIES;  Surgeon: Mellody Drown, MD;  Location: [facility] ORS;  Service: Gynecology;  Laterality: N/A;   PUNCH BIOPSY OF SKIN  07/23/2020       TUBAL LIGATION Bilateral 1980  Past Gynecologic History:  G3P2. Denies abnormal pap smears. Tubal ligation in 1980s. Not currently sexually active. Denies history of STIs. Denies HRT use.   OB History:  OB History  Gravida Para Term Preterm AB Living  _0 SAB IAB  Ectopic Multiple Live Births  1       2    # Outcome Date GA Lbr Len/2nd Weight Sex Delivery Anes PTL Lv  3 SAB           2 Term           1 Term             Obstetric Comments  svd x 2. sab x 1    Family History: Family History  Problem Relation Age of Onset   Dementia Mother    Heart disease Father    Heart attack Father    Diabetes Maternal Grandmother    Heart attack Maternal Grandfather    Breast cancer Neg Hx     Social History: Social History   Socioeconomic History   Marital status: Widowed    Spouse name: Not on file   Number of children: Not on file   Years of education: Not on file   Highest education level: Not on file  Occupational History   Not on file  Tobacco Use   Smoking status: Never   Smokeless tobacco: Never  Vaping Use   Vaping Use: Never used  Substance and Sexual Activity   Alcohol use: No   Drug use: No   Sexual activity: Never  Other Topics Concern   Not on file  Social History Narrative   Not on file   Social Determinants of Health   Financial Resource Strain: Not on file  Food Insecurity: Not on file  Transportation Needs: Not on file  Physical Activity: Not on file  Stress: Not on file  Social Connections: Not on file  Intimate Partner Violence: Not on file    Allergies: Allergies  Allergen Reactions   Seasonal Ic [Cholestatin] Other (See Comments)    Stuffy, nasal drainage    Current Medications: Current Outpatient Medications  Medication Sig Dispense Refill   alendronate (FOSAMAX) 70 MG tablet TAKE 1 TABLET EVERY 7 DAYS WITH A FULL GLASS OF WATER ON AN EMPTY STOMACH DO NOT LIE DOWN FOR AT LEAST 30 MIN 12 tablet 0   amLODipine (NORVASC) 5 MG tablet Take 1 tablet (5 mg total) by mouth daily. 90 tablet 3   aspirin EC 81 MG tablet Take 81 mg by mouth daily.     atorvastatin (LIPITOR) 10 MG tablet TAKE 1 TABLET BY MOUTH DAILY 90 tablet 3   calcium elemental as carbonate (BARIATRIC TUMS ULTRA) 400 MG chewable tablet  Chew 1,000 mg by mouth daily as needed for heartburn.     clobetasol ointment (TEMOVATE) 0.05 % Apply to affected area qhs x 4wks. Then every other night x 4wks and then 2x/wk x 4 wks. 45 g 2   diphenhydrAMINE (BENADRYL) 25 mg capsule Take 25 mg by mouth at bedtime as needed for allergies or sleep.     ibuprofen (ADVIL,MOTRIN) 600 MG tablet Take 1 tablet (600 mg total) by mouth every 6 (six) hours. 65 tablet 1   lisinopril (ZESTRIL) 20 MG tablet Take 1 tablet (20 mg total) by mouth daily. 90 tablet 1   metFORMIN (GLUCOPHAGE) 500 MG tablet TAKE ONE TABLET BY MOUTH EVERY DAY WITH SUPPER 90 tablet 0  naproxen sodium (ALEVE) 220 MG tablet Take 220-440 mg by mouth daily as needed (pain).     Vitamin D, Ergocalciferol, (DRISDOL) 1.25 MG (50000 UNIT) CAPS capsule TAKE 1 CAPSULE BY MOUTH EVERY 7 DAYS 12 capsule 1   No current facility-administered medications for this visit.   Review of Systems General:  no complaints Skin: no complaints Eyes: no complaints HEENT: no complaints Breasts: no complaints Pulmonary: no complaints Cardiac: no complaints Gastrointestinal: no complaints Genitourinary/Sexual: no complaints Ob/Gyn: no complaints Musculoskeletal: no complaints Hematology: no complaints Neurologic/Psych: no complaints   Objective:  Physical Examination:  BP (!) 153/67   Pulse 70   Temp 97.8 F (36.6 C)   Resp 20   Wt 145 lb 4.8 oz (65.9 kg)   SpO2 100%   BMI 27.91 kg/m     ECOG Performance Status: 0 - Asymptomatic   GENERAL: Patient is a well appearing female in no acute distress HEENT:  Sclera clear. Anicteric NODES:  Negative axillary, supraclavicular, inguinal lymph node survery LUNGS:  Clear to auscultation bilaterally.   HEART:  Regular rate and rhythm.  ABDOMEN:  Soft, nontender.  No hernias, incisions well healed. No masses or ascites EXTREMITIES:  No peripheral edema. Atraumatic. No cyanosis SKIN:  Clear with no obvious rashes or skin changes.  NEURO:  Nonfocal.  Well oriented.  Appropriate affect.  Pelvic: Exam chaperoned by RN: EGBUS/Vagina: large rectocoele, Cervix/Uterus surgically absent. Bimanual no masses.    Assessment:  RENEA SCHOONMAKER is a 80 y.o. 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 NED.  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 NED  Medical co-morbidities complicating care: Atrial fibrillation, T2DM   Plan:   Problem List Items Addressed This Visit       Genitourinary   Uterine cancer ([place]) - Primary   It has been more than three years since surgery and her risk of recurrence is very low.  She will follow up with Dr Ilda Basset q 6 months for two years and then annually.  We can see her back if any problems arise.    Beckey Rutter, DNP, AGNP-C [place] at [facility] [phone] (clinic)  I personally interviewed and examined the patient. Agreed with the above/below plan of care. I have directly contributed to assessment and plan of care of this patient and educated and discussed with patient and family.  Mellody Drown, MD  CC:  Referring Provider: Dr. Ilda Basset

## 2021-02-06 NOTE — Patient Instructions (Addendum)
Please contact Dr. Ilda Basset office for follow up in 6 months. I didn't schedule you a follow up at the [facility] but if you have concerns or questions in the future please feel free to reach out to [place]. Thank you for allowing [place] to participate in your care.  Beckey Rutter, NP & Dr. Fransisca Connors

## 2021-04-17 DIAGNOSIS — E559 Vitamin D deficiency, unspecified: Secondary | ICD-10-CM | POA: Diagnosis not present

## 2021-04-17 DIAGNOSIS — M810 Age-related osteoporosis without current pathological fracture: Secondary | ICD-10-CM | POA: Diagnosis not present

## 2021-05-16 ENCOUNTER — Other Ambulatory Visit: Payer: Self-pay | Admitting: Family

## 2021-05-16 DIAGNOSIS — E1165 Type 2 diabetes mellitus with hyperglycemia: Secondary | ICD-10-CM

## 2021-06-12 DIAGNOSIS — M810 Age-related osteoporosis without current pathological fracture: Secondary | ICD-10-CM | POA: Diagnosis not present

## 2021-08-01 DIAGNOSIS — M810 Age-related osteoporosis without current pathological fracture: Secondary | ICD-10-CM | POA: Diagnosis not present

## 2021-08-01 DIAGNOSIS — R7303 Prediabetes: Secondary | ICD-10-CM | POA: Diagnosis not present

## 2021-08-01 DIAGNOSIS — Z1389 Encounter for screening for other disorder: Secondary | ICD-10-CM | POA: Diagnosis not present

## 2021-08-01 DIAGNOSIS — C549 Malignant neoplasm of corpus uteri, unspecified: Secondary | ICD-10-CM | POA: Diagnosis not present

## 2021-08-01 DIAGNOSIS — Z780 Asymptomatic menopausal state: Secondary | ICD-10-CM | POA: Diagnosis not present

## 2021-08-01 DIAGNOSIS — I10 Essential (primary) hypertension: Secondary | ICD-10-CM | POA: Diagnosis not present

## 2021-08-01 DIAGNOSIS — Z0000 Encounter for general adult medical examination without abnormal findings: Secondary | ICD-10-CM | POA: Diagnosis not present

## 2021-08-20 ENCOUNTER — Other Ambulatory Visit: Payer: Self-pay | Admitting: Family

## 2021-08-20 DIAGNOSIS — E1165 Type 2 diabetes mellitus with hyperglycemia: Secondary | ICD-10-CM

## 2021-08-22 ENCOUNTER — Ambulatory Visit: Payer: Medicare PPO | Admitting: Obstetrics and Gynecology

## 2021-08-22 ENCOUNTER — Encounter: Payer: Self-pay | Admitting: Obstetrics and Gynecology

## 2021-08-22 VITALS — BP 168/62 | HR 71 | Wt 137.0 lb

## 2021-08-22 DIAGNOSIS — C55 Malignant neoplasm of uterus, part unspecified: Secondary | ICD-10-CM

## 2021-08-22 DIAGNOSIS — N9089 Other specified noninflammatory disorders of vulva and perineum: Secondary | ICD-10-CM

## 2021-08-22 MED ORDER — CLOBETASOL PROPIONATE 0.05 % EX OINT: TOPICAL_OINTMENT | CUTANEOUS | 2 refills | Status: DC

## 2021-08-22 NOTE — Progress Notes (Signed)
Obstetrics and Gynecology Visit ?Return Patient Evaluation ? ?Appointment Date: 08/22/2021 ? ?Primary Care Provider: Elby Drake ? ?OBGYN Clinic: [facility] for [facility] ? ?Chief Complaint: surveillance stage 1, grade 1 endometrioid uterine cancer 09/2017 ? ?History of Present Illness:  Claire Drake is a 81 y.o. doing well and no complaints or issues. Last seen by Claire Drake in Sept 2022 and was doing well and plan for q31mchecks with me for the next two years ? ?She stopped the clobetasol ointment mid last year b/c her exam was negative ? ?Review of Systems: as noted in the History of Present Illness. ? ? ?Patient Active Problem List  ? Diagnosis Date Noted  ? Vulvar pruritus 07/23/2020  ? Essential hypertension 05/16/2019  ? Age-related osteoporosis without current pathological fracture 03/26/2018  ? Vitamin D deficiency 03/26/2018  ? Irregular heart rhythm 08/28/2017  ? [place] 08/18/2017  ? Vaginal discharge 08/13/2017  ? Transient hypertension 08/13/2017  ? Uncontrolled [place] 2 diabetes [place]cemia [place] 08/06/2017  ? BCC (basal cell carcinoma), face 08/02/2013  ? ?Medications:  ?Claire Drake had no medications administered during this visit. ?Current Outpatient Medications  ?Medication Sig Dispense Refill  ? alendronate (FOSAMAX) 70 MG tablet TAKE 1 TABLET EVERY 7 DAYS WITH A FULL GLASS OF WATER ON AN EMPTY STOMACH DO NOT LIE DOWN FOR AT LEAST 30 MIN 12 tablet 0  ? amLODipine (NORVASC) 5 MG tablet Take 1 tablet (5 mg total) by mouth daily. 90 tablet 3  ? aspirin EC 81 MG tablet Take 81 mg by mouth daily.    ? atorvastatin (LIPITOR) 10 MG tablet TAKE 1 TABLET BY MOUTH DAILY 90 tablet 3  ? calcium elemental as carbonate (BARIATRIC TUMS ULTRA) 400 MG chewable tablet Chew 1,000 mg by mouth daily as needed for heartburn.    ? diphenhydrAMINE (BENADRYL) 25 mg capsule Take 25 mg by mouth at bedtime as needed for allergies or sleep.    ? ibuprofen (ADVIL,MOTRIN) 600  MG tablet Take 1 tablet (600 mg total) by mouth every 6 (six) hours. 65 tablet 1  ? lisinopril (ZESTRIL) 20 MG tablet Take 1 tablet (20 mg total) by mouth daily. 90 tablet 1  ? metFORMIN (GLUCOPHAGE) 500 MG tablet TAKE 1 TABLET BY MOUTH DAILY WITH SUPPER 90 tablet 0  ? naproxen sodium (ALEVE) 220 MG tablet Take 220-440 mg by mouth daily as needed (pain).    ? Vitamin D, Ergocalciferol, (DRISDOL) 1.25 MG (50000 UNIT) CAPS capsule TAKE 1 CAPSULE BY MOUTH EVERY 7 DAYS 12 capsule 1  ? clobetasol ointment (TEMOVATE) 0.05 % Apply to affected area qhs x 2wks. Then 2x/week 45 g 2  ? ?No current facility-administered medications for this visit.  ? ? ?Allergies: is allergic to seasonal ic [cholestatin]. ? ?Physical Exam:  ?BP (!) 168/62   Pulse 71   Wt 137 lb (62.1 kg)   BMI 26.32 kg/m?  Body mass index is 26.32 kg/m?. ?General appearance: Well nourished, well developed female in no acute distress.  ?Abdomen: diffusely non tender to palpation, non distended, and no masses, hernias ?Neuro/Psych:  Normal mood and affect.   ? ?Pelvic exam:  ?EGBUS: moderate atrophy. Some irritation at the posterior fourchette, b/l lower labia and perianal at the creases, nttp ?Vaginal vault: normal, nttp ?Cuff: normal, nttp ?Bimanual: negative ? ? ?Assessment: pt doing well ? ?Plan: ? ?1. Malignant neoplasm of uterus, unspecified site ([facility] ?Repeat exam in 6 months. VB, spotting, etc precautions given ? ?  2. Vulvar irritation ?Pt asymptomatic. 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 Recommend the clobetasol qhs x 2wks and then 2x/wk ? ? ?RTC: 70m? ?CDurene RomansMD ?Attending ?Center for [employer](Fish farm manager ? ? ?

## 2021-08-22 NOTE — Progress Notes (Signed)
RGYN presents for F/U visit today. ?Last seen 11/29/20 for intertriginous candidiasis and lichenoid dermatitis. ? ? ? ? ?

## 2021-09-23 ENCOUNTER — Other Ambulatory Visit: Payer: Self-pay | Admitting: Family Medicine

## 2021-09-23 DIAGNOSIS — I10 Essential (primary) hypertension: Secondary | ICD-10-CM

## 2022-02-11 ENCOUNTER — Ambulatory Visit: Payer: Medicare PPO | Admitting: Obstetrics and Gynecology

## 2022-02-11 ENCOUNTER — Encounter: Payer: Self-pay | Admitting: Obstetrics and Gynecology

## 2022-02-11 VITALS — BP 154/77 | HR 67 | Wt 132.0 lb

## 2022-02-11 DIAGNOSIS — N814 Uterovaginal prolapse, unspecified: Secondary | ICD-10-CM

## 2022-02-11 MED ORDER — CLOBETASOL PROPIONATE 0.05 % EX OINT: TOPICAL_OINTMENT | CUTANEOUS | 2 refills | Status: DC

## 2022-02-11 NOTE — Progress Notes (Signed)
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 doing well and no complaints or issues. Last seen by Dr. Fransisca Connors in Sept 2022 and was doing well and plan for q21mchecks with me for the next two years   She restarted on the clobetasol ointment at her last visit in April b/c of some lower labia and perianal residual disease there. 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   Interval History: Since that time, she states that she is asymptomatic with no bleeding, spotting or vaginal irritation. She continues on the vaginal ointment.   Review of Systems: as noted in the History of Present Illness.  Patient Active Problem List   Diagnosis Date Noted   Essential hypertension 05/16/2019   Age-related osteoporosis without current pathological fracture 03/26/2018   Vitamin D deficiency 03/26/2018   Irregular heart rhythm 08/28/2017   History of uterine cancer 08/18/2017   Transient hypertension 08/13/2017   [place] 08/06[place]   BCC (basal cell carcinoma), face 08/02/2013   Medications:  [name] had no medications administered during this visit. Current Outpatient Medications  Medication Sig Dispense Refill   alendronate (FOSAMAX) 70 MG tablet TAKE 1 TABLET EVERY 7 DAYS WITH A FULL GLASS OF WATER ON AN EMPTY STOMACH DO NOT LIE DOWN FOR AT LEAST 30 MIN 12 tablet 0   amLODipine (NORVASC) 5 MG tablet Take 1 tablet (5 mg total) by mouth daily. 90 tablet 3   aspirin EC 81 MG tablet Take 81 mg by mouth daily.     atorvastatin (LIPITOR) 10 MG tablet TAKE 1 TABLET BY MOUTH DAILY 90 tablet 3   calcium elemental as carbonate (BARIATRIC TUMS ULTRA) 400 MG  chewable tablet Chew 1,000 mg by mouth daily as needed for heartburn.     diphenhydrAMINE (BENADRYL) 25 mg capsule Take 25 mg by mouth at bedtime as needed for allergies or sleep.     ibuprofen (ADVIL,MOTRIN) 600 MG tablet Take 1 tablet (600 mg total) by mouth every 6 (six) hours. 65 tablet 1   lisinopril (ZESTRIL) 20 MG tablet Take 1 tablet (20 mg total) by mouth daily. 90 tablet 1   metFORMIN (GLUCOPHAGE) 500 MG tablet TAKE 1 TABLET BY MOUTH DAILY WITH SUPPER 90 tablet 0   naproxen sodium (ALEVE) 220 MG tablet Take 220-440 mg by mouth daily as needed (pain).     Vitamin D, Ergocalciferol, (DRISDOL) 1.25 MG (50000 UNIT) CAPS capsule TAKE 1 CAPSULE BY MOUTH EVERY 7 DAYS 12 capsule 1   clobetasol ointment (TEMOVATE) 0.05 % Apply to affected area qhs x 2wks. Then 2x/week until 45 g 2   No current facility-administered medications for this visit.    Allergies: is allergic to seasonal ic [cholestatin].  Physical Exam:  BP (!) 154/77   Pulse 67   Wt 132 lb (59.9 kg)   BMI 25.36 kg/m  Body mass index is 25.36 kg/m. General appearance: Well nourished, well developed female in no acute distress.  Neuro/Psych:  Normal mood and affect.    Pelvic exam:  EGBUS: moderate atrophy. Still some slight irritation at the genital hiatus/posterior fourchette, and perianal at the creases, nttp. Stage II cystocele noted Vaginal vault: normal, nttp Cuff: normal, nttp Bimanual: negative   Assessment:  patient doing well  Plan:  1. Cystocele with prolapse Patient asymptomatic in terms of lower urinary tract s/s but I recommend consult with Urogyn to hopefully keep it from getting worse  2. History of uterine cancer Negative exam today. Continue q70mvisits until May 2019 and then can do yearly  3. Vulvar irritation.  I showed her on anatomy diagram where to make sure she applies   RTC: 650mChAletha HalimJrBrooke BonitoD Attending Center for [employer]

## 2022-02-17 ENCOUNTER — Other Ambulatory Visit: Payer: Self-pay | Admitting: *Deleted

## 2022-02-17 MED ORDER — CLOBETASOL PROPIONATE 0.05 % EX OINT: TOPICAL_OINTMENT | CUTANEOUS | 2 refills | Status: DC

## 2022-02-17 NOTE — Progress Notes (Signed)
Recent electronically, was accidentally on print, so Rx wasn't sent

## 2022-05-16 ENCOUNTER — Ambulatory Visit: Payer: Medicare PPO | Admitting: Obstetrics and Gynecology

## 2022-05-16 ENCOUNTER — Encounter: Payer: Self-pay | Admitting: Obstetrics and Gynecology

## 2022-05-16 ENCOUNTER — Ambulatory Visit (INDEPENDENT_AMBULATORY_CARE_PROVIDER_SITE_OTHER): Payer: Medicare PPO | Admitting: Obstetrics and Gynecology

## 2022-05-16 VITALS — BP 161/88 | HR 78 | Ht 60.0 in | Wt 136.0 lb

## 2022-05-16 DIAGNOSIS — N993 Prolapse of vaginal vault after hysterectomy: Secondary | ICD-10-CM | POA: Diagnosis not present

## 2022-05-16 DIAGNOSIS — N816 Rectocele: Secondary | ICD-10-CM | POA: Diagnosis not present

## 2022-05-16 DIAGNOSIS — R350 Frequency of micturition: Secondary | ICD-10-CM

## 2022-05-16 DIAGNOSIS — N8110 Cystocele, unspecified: Secondary | ICD-10-CM | POA: Diagnosis not present

## 2022-05-16 LAB — POCT URINALYSIS DIPSTICK
Bilirubin, UA: NEGATIVE
Blood, UA: NEGATIVE
Glucose, UA: NEGATIVE
Ketones, UA: NEGATIVE
Nitrite, UA: NEGATIVE
Protein, UA: NEGATIVE
Spec Grav, UA: 1.02 (ref 1.010–1.025)
Urobilinogen, UA: 0.2 E.U./dL
pH, UA: 6.5 (ref 5.0–8.0)

## 2022-05-16 NOTE — Progress Notes (Signed)
[facility] New Patient Evaluation and Consultation  Referring Provider: Aletha Halim, MD PCP: Gladstone Lighter, MD Date of Service: 05/16/2022  SUBJECTIVE Chief Complaint: New Patient (Initial Visit) Claire Drake is a 82 y.o. female here for a f/u on proplapse/)  History of Present Illness: Claire Drake is a 82 y.o. White or Caucasian female seen in consultation at the request of Dr. Ilda Basset for evaluation of prolapse.    Review of records significant for: History of uterine cancer, s/p TLH, BSO, SLN mapping and biopsies on 09/09/2017 with Dr Fransisca Connors. Has cystocele noted on exam.   Urinary Symptoms: Does not leak urine.   Day time voids 10.  Nocturia: 1 times per night to void. Voiding dysfunction: she empties her bladder well.  does not use a catheter to empty bladder.  When urinating, she feels she has no difficulties Drinks: 1 cup coffee in AM,  5-6 glasses water per day per day, occasional tea She is not bothered by her urinary frequency.    UTIs:  0  UTI's in the last year.   Denies history of blood in urine and kidney or bladder stones  Pelvic Organ Prolapse Symptoms:                  She Denies a feeling of a bulge the vaginal area- she does feel something that is dropping.  It is not bothersome.   Bowel Symptom: Bowel movements: 1 time(s) per day Stool consistency: loose Straining: no.  Splinting: no.  Incomplete evacuation: no.  She Admits to accidental bowel leakage / fecal incontinence  Occurs: seldomly  Consistency with leakage: soft / loose, when she eats something disagreeable. Not bothersome symptom for her.    Sexual Function Sexually active: no.   Pelvic Pain Denies pelvic pain   Past Medical History:  Past Medical History:  Diagnosis Date   Arthritis    Cancer ([place]) 08/2017   endometrial cancer; skin cancer on lip and one on head   Dysrhythmia    seeing Dr. Rockey Situ 09/07/2017   GERD (gastroesophageal reflux disease)     occasionally   Hypertension 08/2017   just recently with new diagnosis   Rectocele 2019   Uncontrolled type 2 diabetes mellitus with hyperglycemia ([place]) 08/06/2017   A1C 8.7  07/2017     Past Surgical History:   Past Surgical History:  Procedure Laterality Date   ABDOMINAL HYSTERECTOMY     APPENDECTOMY  1970   CATARACT EXTRACTION, BILATERAL  05/12/2002   CHOLECYSTECTOMY  1978   LAPAROSCOPIC HYSTERECTOMY N/A 09/09/2017   Procedure: HYSTERECTOMY TOTAL LAPAROSCOPIC WITH REMOVAL OF OVARIES AND TUBES, SENTINEL LYMPH NODE MAPPING AND BIOPSIES;  Surgeon: Mellody Drown, MD;  Location: [facility] ORS;  Service: Gynecology;  Laterality: N/A;   PUNCH BIOPSY OF SKIN  07/23/2020       TUBAL LIGATION Bilateral 1980     Past OB/GYN History: OB History  Gravida Para Term Preterm AB Living  '3 2 2   1 2  '$ SAB IAB Ectopic Multiple Live Births  1       2    # Outcome Date GA Lbr Len/2nd Weight Sex Delivery Anes PTL Lv  3 SAB           2 Term      Vag-Spont     1 Term      Vag-Spont       Obstetric Comments  svd x 2. sab x 1   S/p hysterectomy for endometrial  cancer.    Medications: She has a current medication list which includes the following prescription(s): amlodipine, aspirin ec, atorvastatin, calcium elemental as carbonate, diphenhydramine, lisinopril, metformin, and vitamin d (ergocalciferol).   Allergies: Patient is allergic to seasonal ic [cholestatin].   Social History:  Social History   Tobacco Use   Smoking status: Never   Smokeless tobacco: Never  Vaping Use   Vaping Use: Never used  Substance Use Topics   Alcohol use: No   Drug use: No    Relationship status: widowed She lives alone She is not employed. Regular exercise: Yes: walking History of abuse: No  Family History:   Family History  Problem Relation Age of Onset   Dementia Mother    Heart disease Father    Heart attack Father    Diabetes Maternal Grandmother    Heart attack Maternal Grandfather    Breast  cancer Neg Hx      Review of Systems: Review of Systems  Constitutional:  Negative for fever, malaise/fatigue and weight loss.  Respiratory:  Negative for cough, shortness of breath and wheezing.   Cardiovascular:  Negative for chest pain, palpitations and leg swelling.  Gastrointestinal:  Negative for abdominal pain and blood in stool.  Genitourinary:  Negative for dysuria.  Musculoskeletal:  Negative for myalgias.  Skin:  Negative for rash.  Neurological:  Negative for dizziness and headaches.  Endo/Heme/Allergies:  Does not bruise/bleed easily.  Psychiatric/Behavioral:  Negative for depression. The patient is not nervous/anxious.      OBJECTIVE Physical Exam: Vitals:   05/16/22 1507 05/16/22 1513  BP: (!) 172/89 (!) 161/88  Pulse: 71 78  Weight: 136 lb (61.7 kg)   Height: 5' (1.524 m)     Physical Exam   GU / Detailed Urogynecologic Evaluation:  Pelvic Exam: Normal external female genitalia; Bartholin's and Skene's glands normal in appearance; urethral meatus normal in appearance, no urethral masses or discharge.   CST: negative  s/p hysterectomy: Speculum exam reveals normal vaginal mucosa with  atrophy and normal vaginal cuff. No masses on bimanual  Pelvic floor strength I/V  Pelvic floor musculature: Right levator non-tender, Right obturator non-tender, Left levator non-tender, Left obturator non-tender  POP-Q:   POP-Q  -1.5                                            Aa   -1.5                                           Ba  -3.5                                              C   4.5                                            Gh  4  Pb  5.5                                            tvl   0                                            Ap  0                                            Bp                                                 D      Rectal Exam:  Normal external rectum  Post-Void Residual (PVR) by  Bladder Scan: In order to evaluate bladder emptying, we discussed obtaining a postvoid residual and she agreed to this procedure.  Procedure: The ultrasound unit was placed on the patient's abdomen in the suprapubic region after the patient had voided. A PVR of 24 ml was obtained by bladder scan.  Laboratory Results: POC urine: small leukocytes   ASSESSMENT AND PLAN Ms. Odle is a 82 y.o. with:  1. Prolapse of anterior vaginal wall   2. Prolapse of posterior vaginal wall   3. Vaginal vault prolapse after hysterectomy   4. Urinary frequency    Stage II anterior, Stage II posterior, Stage I apical prolapse - For treatment of pelvic organ prolapse, we discussed options for management including expectant management, conservative management, and surgical management, such as Kegels, a pessary, pelvic floor physical therapy, and specific surgical procedures. - Overall she is not bothered by her prolapse. Can expectantly manage. Discussed limiting heavy lifting or straining to prevent progression.   2. Urinary frequency - not bothered by this symptom - Will send urine for culture due to leukocytes  Return as needed   Jaquita Folds, MD

## 2022-05-19 LAB — URINE CULTURE: Organism ID, Bacteria: NO GROWTH

## 2022-06-30 ENCOUNTER — Encounter: Payer: Self-pay | Admitting: *Deleted

## 2022-07-10 ENCOUNTER — Telehealth: Payer: Self-pay

## 2022-07-10 NOTE — Telephone Encounter (Signed)
Left vm for pt to call office back regarding her checkup in April w/ Dr. Ilda Basset to get scheduled.

## 2022-08-21 ENCOUNTER — Ambulatory Visit: Payer: Medicare PPO | Admitting: Obstetrics and Gynecology

## 2022-08-21 ENCOUNTER — Encounter: Payer: Self-pay | Admitting: Obstetrics and Gynecology

## 2022-08-21 ENCOUNTER — Other Ambulatory Visit (HOSPITAL_COMMUNITY)
Admission: RE | Admit: 2022-08-21 | Discharge: 2022-08-21 | Disposition: A | Discharge status: Home / Self Care | Payer: Medicare PPO | Source: Ambulatory Visit | Attending: Obstetrics and Gynecology | Admitting: Obstetrics and Gynecology

## 2022-08-21 VITALS — BP 159/113 | HR 150 | Wt 141.0 lb

## 2022-08-21 DIAGNOSIS — Z8542 Personal history of malignant neoplasm of other parts of uterus: Secondary | ICD-10-CM | POA: Diagnosis not present

## 2022-08-21 DIAGNOSIS — N905 Atrophy of vulva: Secondary | ICD-10-CM | POA: Diagnosis not present

## 2022-08-21 HISTORY — PX: BIOPSY VULVA: PRO42

## 2022-08-21 NOTE — Procedures (Signed)
Vulvar Biopsy Procedure Note  Pre-operative Diagnosis: Vulvar erythema and atrophy. Lichen sclerous  Post-operative Diagnosis: same  Procedure Details  Cervical exam performed in the presence of a chaperone  The patient was placed in the dorsal lithotomy position. A Pederson speculum inserted in the vagina and the vagina and cuff inspected and all wnl and negative bimanual exam. Anterior vault prolapse stable to -1.   Vulva with erythema at 11 o'clock and 4 o'clock, in particular, and mild scattered erythema on bilateral labia and at rectum. Nttp. Biopsy done at 4 o'clock on left vulva. Silver nitrate applied to area.   The sample was sent for pathologic examination.  Condition: Stable  Complications: None  Plan: Follow up biopsy results  Claire Copa MD Attending Center for [facility])

## 2022-08-21 NOTE — Progress Notes (Signed)
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 doing well and no complaints or issues. Last seen by Claire Drake in Sept 2022 and was doing well and plan for q40m checks with me for the next two years. 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 Pt still without any vulvar s/s and hasn't used ointment in quite some time. At last visit in October, plan was to continue on the ointment  Patient has seen urogyn for POP and since she is asymptomatic, plan is just expectant management. Pt continues to deny and POP s/s  Review of Systems: as noted in the History of Present Illness.  Patient Active Problem List   Diagnosis Date Noted   Essential hypertension 05/16/2019   Age-related osteoporosis without current pathological fracture 03/26/2018   Vitamin D deficiency 03/26/2018   Irregular heart rhythm 08/28/2017   History of uterine cancer 08/18/2017   Transient hypertension 08/13/2017   Uncontrolled type 2 diabetes mellitus with hyperglycemia 08/06/2017   BCC (basal cell carcinoma), face 08/02/2013   Medications:  Claire Drake had no medications administered during this visit. Current Outpatient Medications  Medication Sig Dispense Refill   amLODipine (NORVASC) 5 MG tablet Take 1 tablet (5 mg total) by mouth daily. 90 tablet 3   aspirin EC 81 MG tablet Take 81 mg by mouth daily.     atorvastatin (LIPITOR) 10 MG tablet TAKE 1 TABLET BY MOUTH DAILY 90 tablet 3   calcium elemental as carbonate (BARIATRIC TUMS ULTRA) 400 MG chewable tablet Chew 1,000 mg by mouth daily as needed for heartburn.     diphenhydrAMINE (BENADRYL) 25  mg capsule Take 25 mg by mouth at bedtime as needed for allergies or sleep.     lisinopril (ZESTRIL) 20 MG tablet Take 1 tablet (20 mg total) by mouth daily. 90 tablet 1   metFORMIN (GLUCOPHAGE) 500 MG tablet TAKE 1 TABLET BY MOUTH DAILY WITH SUPPER 90 tablet 0   Vitamin D, Ergocalciferol, (DRISDOL) 1.25 MG (50000 UNIT) CAPS capsule TAKE 1 CAPSULE BY MOUTH EVERY 7 DAYS 12 capsule 1   No current facility-administered medications for this visit.    Allergies: is allergic to seasonal ic [cholestatin].  Physical Exam:  BP (!) 159/113   Pulse (!) 150   Wt 141 lb (64 kg)   BMI 27.54 kg/m  Body mass index is 27.54 kg/m. General appearance: Well nourished, well developed female in no acute distress.  Neuro/Psych:  Normal mood and affect.    Pelvic exam:  Exam performed in the presence of a chaperone Vulva with erythema at 11 o'clock and 4 o'clock, in particular, and mild scattered erythema on bilateral labia and at rectum. Nttp. Biopsy done at 4 o'clock on left vulva. Silver nitrate applied to area.  Vaginal vault: negative Cuff: negative Bimanual: negative   Assessment: pt stable  Plan:  1. Atrophy, vulva F/u biopsy. Reviewed with her where to apply if ointment is done again - Surgical pathology( [place]/ [facility])  2. Uterine cancer Continue q22m surveillance until late 2024 and then can do yearly.   3. HTN Patient denies any s/s HA, visual changes, etc and has increased life stressor. Will CC PCP,  but she says BP is better than usual.   Return in about 3 months (around 11/20/2022) for in person, md visit, with dr Claire Drake.  No future appointments.  Claire Copa MD Attending Center for [employer] [facility])

## 2022-08-21 NOTE — Progress Notes (Signed)
Patient here for F/U today last seen in office 02/11/22 per notes F/U every six months.   pt has no concerns or complaints.

## 2022-08-22 LAB — SURGICAL PATHOLOGY

## 2022-08-26 ENCOUNTER — Encounter: Payer: Self-pay | Admitting: Obstetrics and Gynecology

## 2022-08-26 ENCOUNTER — Telehealth: Payer: Self-pay | Admitting: *Deleted

## 2022-08-26 DIAGNOSIS — N904 Leukoplakia of vulva: Secondary | ICD-10-CM | POA: Insufficient documentation

## 2022-08-26 NOTE — Telephone Encounter (Signed)
Left message to talk about biopsy results, left message for pt to call back and that I will also send a mychart message

## 2022-08-26 NOTE — Telephone Encounter (Signed)
-----   Message from [place] Bing, MD sent at 08/26/2022 11:20 AM EDT ----- Can you let her know that the biopsy came back as non cancer but the cells can very rarely turn into cancer without continuing on the ointment? No change in plan and tell her to continue using the ointment as we talked about and make her a 3 month f/u with me, in person. thanks

## 2022-12-09 ENCOUNTER — Telehealth: Payer: Self-pay

## 2022-12-09 NOTE — Telephone Encounter (Signed)
Left message for pt to call office back regarding follow up w/ Dr. Vergie Living

## 2022-12-18 ENCOUNTER — Ambulatory Visit: Payer: Medicare PPO | Admitting: Obstetrics and Gynecology

## 2022-12-25 ENCOUNTER — Encounter: Payer: Self-pay | Admitting: Obstetrics and Gynecology

## 2022-12-25 ENCOUNTER — Ambulatory Visit: Payer: Medicare PPO | Admitting: Obstetrics and Gynecology

## 2022-12-25 VITALS — BP 158/73 | HR 68

## 2022-12-25 DIAGNOSIS — Z8542 Personal history of malignant neoplasm of other parts of uterus: Secondary | ICD-10-CM

## 2022-12-25 DIAGNOSIS — N904 Leukoplakia of vulva: Secondary | ICD-10-CM | POA: Diagnosis not present

## 2022-12-25 MED ORDER — CLOBETASOL PROPIONATE 0.05 % EX OINT: TOPICAL_OINTMENT | CUTANEOUS | 2 refills | Status: AC

## 2022-12-26 NOTE — Progress Notes (Unsigned)
Obstetrics and Gynecology Visit Return Patient Evaluation  Appointment Date: 12/25/2022  Primary Care Provider: Enid Baas  OBGYN Clinic: [facility]   Chief Complaint: vulvar lichen sclerous surveillance and stage 1, grade 1 endometrioid uterine cancer (09/2017) surveillance)  History of Present Illness:  Claire Drake is a 82 y.o. no issues or concerns today. Specifically, she denies any vulvar/vaginal itching, vaginal bleeding or concern.   Review of Systems: as noted in the History of Present Illness.  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 [place] had no medications administered during this visit. Current Outpatient Medications  Medication Sig Dispense Refill   amLODipine (NORVASC) 5 MG tablet Take 1 tablet (5 mg total) by mouth daily. 90 tablet 3   aspirin EC 81 MG tablet Take 81 mg by mouth daily.     atorvastatin (LIPITOR) 10 MG tablet TAKE 1 TABLET BY MOUTH DAILY 90 tablet 3   calcium elemental as carbonate (BARIATRIC TUMS ULTRA) 400 MG chewable tablet Chew 1,000 mg by mouth daily as needed for heartburn.     clobetasol ointment (TEMOVATE) 0.05 % One applicator full to the qhs 60 g 2   diphenhydrAMINE (BENADRYL) 25 mg capsule Take 25 mg by mouth at bedtime as needed for allergies or sleep.     lisinopril (ZESTRIL) 20 MG tablet Take 1 tablet (20 mg total) by mouth daily. 90 tablet 1   metFORMIN (GLUCOPHAGE) 500 MG tablet TAKE 1 TABLET BY MOUTH DAILY WITH SUPPER 90 tablet 0   Vitamin D,  Ergocalciferol, (DRISDOL) 1.25 MG (50000 UNIT) CAPS capsule TAKE 1 CAPSULE BY MOUTH EVERY 7 DAYS 12 capsule 1   No current facility-administered medications for this visit.   Allergies: is allergic to seasonal ic [cholestatin].  Physical Exam:  BP (!) 158/73   Pulse 68  There is no height or weight on file to calculate BMI. General appearance: Well nourished, well developed female in no acute distress.  Abdomen: diffusely non tender to palpation, non distended, and no masses, hernias Neuro/Psych:  Normal mood and affect.    RN chaperone present Pelvic exam: EGBUS: wnl. No abnormal areas Vaginal vault: negative. Cystocele, stage 2 still present Cuff: negative Bimanual: negative   Assessment: patient doing well  Plan:  1. Lichen sclerosus of vulva Areas are completely healed. Pt doing daily clobetalol. I told her that she can go down to 2-3 times per week. Follow up in 3-5 months  2. History of uterine cancer Can move to 6-12 months  Return in about 4 months (around 04/26/2023) for in person, with dr Vergie Living.  No future appointments.  Cornelia Copa MD Attending Center for [employer] [facility])

## 2023-03-31 ENCOUNTER — Inpatient Hospital Stay
Admit: 2023-03-31 | Discharge: 2023-03-31 | Disposition: A | Discharge status: Home / Self Care | Payer: Medicare PPO | Attending: Obstetrics and Gynecology | Admitting: Obstetrics and Gynecology

## 2023-03-31 ENCOUNTER — Emergency Department: Payer: Medicare PPO

## 2023-03-31 ENCOUNTER — Observation Stay
Admission: EM | Admit: 2023-03-31 | Discharge: 2023-04-01 | Disposition: A | Discharge status: Home / Self Care | Payer: Medicare PPO | Attending: Obstetrics and Gynecology | Admitting: Obstetrics and Gynecology

## 2023-03-31 ENCOUNTER — Other Ambulatory Visit: Payer: Self-pay

## 2023-03-31 DIAGNOSIS — I10 Essential (primary) hypertension: Secondary | ICD-10-CM | POA: Diagnosis present

## 2023-03-31 DIAGNOSIS — Z7984 Long term (current) use of oral hypoglycemic drugs: Secondary | ICD-10-CM | POA: Insufficient documentation

## 2023-03-31 DIAGNOSIS — R000 Tachycardia, unspecified: Secondary | ICD-10-CM | POA: Diagnosis present

## 2023-03-31 DIAGNOSIS — Z7982 Long term (current) use of aspirin: Secondary | ICD-10-CM | POA: Insufficient documentation

## 2023-03-31 DIAGNOSIS — Z79899 Other long term (current) drug therapy: Secondary | ICD-10-CM | POA: Insufficient documentation

## 2023-03-31 DIAGNOSIS — E1165 Type 2 diabetes mellitus with hyperglycemia: Secondary | ICD-10-CM | POA: Diagnosis present

## 2023-03-31 DIAGNOSIS — Z8542 Personal history of malignant neoplasm of other parts of uterus: Secondary | ICD-10-CM

## 2023-03-31 DIAGNOSIS — I340 Nonrheumatic mitral (valve) insufficiency: Secondary | ICD-10-CM | POA: Insufficient documentation

## 2023-03-31 DIAGNOSIS — I4892 Unspecified atrial flutter: Principal | ICD-10-CM | POA: Diagnosis present

## 2023-03-31 DIAGNOSIS — R0602 Shortness of breath: Secondary | ICD-10-CM

## 2023-03-31 DIAGNOSIS — I4891 Unspecified atrial fibrillation: Secondary | ICD-10-CM | POA: Diagnosis not present

## 2023-03-31 LAB — BASIC METABOLIC PANEL
Anion gap: 12 (ref 5–15)
BUN: 19 mg/dL (ref 8–23)
CO2: 25 mmol/L (ref 22–32)
Calcium: 9.8 mg/dL (ref 8.9–10.3)
Chloride: 104 mmol/L (ref 98–111)
Creatinine, Ser: 0.63 mg/dL (ref 0.44–1.00)
GFR, Estimated: >60 mL/min (ref >60)
Glucose, Bld: 120 mg/dL — ABNORMAL HIGH (ref 70–99)
Potassium: 4.5 mmol/L (ref 3.5–5.1)
Sodium: 141 mmol/L (ref 135–145)

## 2023-03-31 LAB — HEPARIN LEVEL (UNFRACTIONATED): Heparin Unfractionated: 0.6 [IU]/mL (ref 0.30–0.70)

## 2023-03-31 LAB — CBC
HCT: 44 % (ref 36.0–46.0)
Hemoglobin: 14.9 g/dL (ref 12.0–15.0)
MCH: 33.2 pg (ref 26.0–34.0)
MCHC: 33.9 g/dL (ref 30.0–36.0)
MCV: 98 fL (ref 80.0–100.0)
Platelets: 216 10*3/uL (ref 150–400)
RBC: 4.49 MIL/uL (ref 3.87–5.11)
RDW: 12.8 % (ref 11.5–15.5)
WBC: 5.9 10*3/uL (ref 4.0–10.5)
nRBC: 0 % (ref 0.0–0.2)

## 2023-03-31 LAB — TSH: TSH: 1.85 u[IU]/mL (ref 0.350–4.500)

## 2023-03-31 LAB — PROTIME-INR
INR: 1 (ref 0.8–1.2)
Prothrombin Time: 13 s (ref 11.4–15.2)

## 2023-03-31 LAB — TROPONIN I (HIGH SENSITIVITY)
Troponin I (High Sensitivity): 9 ng/L (ref <18)
Troponin I (High Sensitivity): 11 ng/L (ref <18)

## 2023-03-31 LAB — APTT: aPTT: 30 s (ref 24–36)

## 2023-03-31 MED ORDER — DILTIAZEM HCL-DEXTROSE 125-5 MG/125ML-% IV SOLN (PREMIX)
5.0000 mg/h | INTRAVENOUS | Status: DC
  Administered 2023-03-31: 5 mg/h via INTRAVENOUS
  Filled 2023-03-31: qty 125

## 2023-03-31 MED ORDER — HEPARIN BOLUS VIA INFUSION
3500.0000 [IU] | Freq: Once | INTRAVENOUS | Status: AC
  Administered 2023-03-31: 3500 [IU] via INTRAVENOUS
  Filled 2023-03-31: qty 3500

## 2023-03-31 MED ORDER — SODIUM CHLORIDE 0.9% FLUSH: 3.0000 mL | INTRAVENOUS | Status: DC | PRN

## 2023-03-31 MED ORDER — SODIUM CHLORIDE 0.9% FLUSH: 3.0000 mL | Freq: Two times a day (BID) | INTRAVENOUS | Status: DC

## 2023-03-31 MED ORDER — HEPARIN (PORCINE) 25000 UT/250ML-% IV SOLN
900.0000 [IU]/h | INTRAVENOUS | Status: DC
  Administered 2023-03-31: 900 [IU]/h via INTRAVENOUS
  Filled 2023-03-31: qty 250

## 2023-03-31 MED ORDER — ACETAMINOPHEN 325 MG PO TABS: 650.0000 mg | ORAL_TABLET | ORAL | Status: DC | PRN

## 2023-03-31 MED ORDER — DILTIAZEM HCL-DEXTROSE 125-5 MG/125ML-% IV SOLN (PREMIX)
5.0000 mg/h | INTRAVENOUS | Status: DC
  Administered 2023-03-31: 5 mg/h via INTRAVENOUS
  Administered 2023-04-01: 15 mg/h via INTRAVENOUS
  Filled 2023-03-31 (×2): qty 125

## 2023-03-31 MED ORDER — SODIUM CHLORIDE 0.9 % IV SOLN: 250.0000 mL | INTRAVENOUS | Status: DC | PRN

## 2023-03-31 MED ORDER — ONDANSETRON HCL 4 MG/2ML IJ SOLN: 4.0000 mg | Freq: Four times a day (QID) | INTRAMUSCULAR | Status: DC | PRN

## 2023-03-31 MED ORDER — METOPROLOL TARTRATE 5 MG/5ML IV SOLN
2.5000 mg | INTRAVENOUS | Status: AC | PRN
  Administered 2023-03-31 (×3): 2.5 mg via INTRAVENOUS
  Filled 2023-03-31 (×2): qty 5

## 2023-03-31 MED ORDER — DILTIAZEM HCL 25 MG/5ML IV SOLN
10.0000 mg | Freq: Once | INTRAVENOUS | Status: AC
  Administered 2023-03-31: 10 mg via INTRAVENOUS
  Filled 2023-03-31: qty 5

## 2023-03-31 MED ORDER — SODIUM CHLORIDE 0.9% FLUSH
10.0000 mL | Freq: Two times a day (BID) | INTRAVENOUS | Status: DC
  Administered 2023-03-31: 10 mL via INTRAVENOUS

## 2023-03-31 MED ORDER — DILTIAZEM LOAD VIA INFUSION
10.0000 mg | Freq: Once | INTRAVENOUS | Status: AC
  Administered 2023-03-31: 10 mg via INTRAVENOUS
  Filled 2023-03-31: qty 10

## 2023-03-31 NOTE — Consult Note (Signed)
ELECTROPHYSIOLOGY CONSULT NOTE    Patient ID: Claire Drake MRN: [id number], DOB/AGE: [birth year] 83 y.o.  Admit date: 03/31/2023 Date of Consult: 03/31/2023  Primary Physician: Enid Baas, MD Primary Cardiologist: None  Electrophysiologist: new to Dr. Nelly Laurence   Referring Provider: Dr. Fanny Bien  Patient Profile: Claire Drake is a 82 y.o. female with a history of HTN, osteoporosis, pre-diabetes, hyperparathyroidism who is being seen today for the evaluation of tachycardia at the request of Dr. Fanny Bien.  HPI:  Claire Drake is a 82 y.o. female with PMH as above. She went to her PCP office earlier today for routine check-up and was found to have a pulse of 150. She was referred to ED for further evaluation. She is asymptomatic, denies palpitations, chest pain, chest pressure. She denies lower extremity edema, sick contacts, or any other concerning symptoms.   Initial labs unremarkable, hypertensive to 150s/100s   ER gave 10mg  dilt IV followed by gtt currently at 10mg /h   Labs Potassium4.5 (11/19 1109)   Creatinine, ser  0.63 (11/19 1109) PLT  216 (11/19 1109) HGB  14.9 (11/19 1109) WBC 5.9 (11/19 1109) Troponin I (High Sensitivity)9 (11/19 1109).    Past Medical History:  Diagnosis Date   Arthritis    Cancer ([facility]) 08/2017   endometrial cancer; skin cancer on lip and one on head   Dysrhythmia    seeing Dr. Mariah Milling 09/07/2017   GERD (gastroesophageal reflux disease)    occasionally   Hypertension 08/2017   just recently with new diagnosis   Rectocele 2019   Uncontrolled type 2 diabetes mellitus with hyperglycemia ([facility]) 08/06/2017   A1C 8.7  07/2017     Surgical History:  Past Surgical History:  Procedure Laterality Date   ABDOMINAL HYSTERECTOMY     APPENDECTOMY  1970   BIOPSY VULVA  08/21/2022   CATARACT EXTRACTION, BILATERAL  05/12/2002   CHOLECYSTECTOMY  1978   LAPAROSCOPIC HYSTERECTOMY N/A 09/09/2017   Procedure: HYSTERECTOMY TOTAL LAPAROSCOPIC WITH REMOVAL OF OVARIES  AND TUBES, SENTINEL LYMPH NODE MAPPING AND BIOPSIES;  Surgeon: Leida Lauth, MD;  Location: [facility] ORS;  Service: Gynecology;  Laterality: N/A;   PUNCH BIOPSY OF SKIN  07/23/2020       TUBAL LIGATION Bilateral 1980     (Not in a hospital admission)   Inpatient Medications:   Allergies:  Allergies  Allergen Reactions   Seasonal Ic [Cholestatin] Other (See Comments)    Stuffy, nasal drainage    Family History  Problem Relation Age of Onset   Dementia Mother    Heart disease Father    Heart attack Father    Diabetes Maternal Grandmother    Heart attack Maternal Grandfather    Breast cancer Neg Hx      Physical Exam: Vitals:   03/31/23 1130 03/31/23 1145 03/31/23 1200 03/31/23 1230  BP: (!) 154/127 (!) 149/109 (!) 141/101 134/87  Pulse: (!) 150 (!) 151 (!) 152 (!) 151  Resp:   12 15  Temp:      TempSrc:      SpO2: 100% 99% 100% 100%  Weight:      Height:        GEN- NAD, A&O x 3, normal affect HEENT: Normocephalic, atraumatic Lungs- CTAB, Normal effort.  Heart- marked tachycardic, regular rate and rhythm.  GI- Soft, NT, ND.  Extremities- No clubbing, cyanosis, or edema   Radiology/Studies: No results found.  EKG: aflutter at 150 (personally reviewed)  TELEMETRY: aflutter in 150s (personally reviewed)    Assessment/Plan: #)  Aflutter Presents to ER from PCP office after found to be tachycardic. She is asymptomatic of episode, unclear when started. Continue dilt gtt, titrate as needed Start heparin, will plan to convert to eliquis prior to discharge. Plan for TTE to confirm normal LVEF TEE/DCCV tomorrow, as schedule allows if she does not convert to sinus  NPO at midnight  #) HTN Permissive HTN at this time      For questions or updates, please contact [facility] Please consult [email or web address] for contact info under Cardiology/STEMI.  Signed, [name], NP  03/31/2023 12:36 PM

## 2023-03-31 NOTE — ED Notes (Signed)
Cardiology NP at bedside. Heparin started, diltiazem titrated to 15 mg/hr.

## 2023-03-31 NOTE — ED Triage Notes (Signed)
Patient went to PCP today and was found to be in aflutter 160's, per report.  AAOx3.  Skin warm and dry. NAD

## 2023-03-31 NOTE — Progress Notes (Signed)
       CROSS COVER NOTE  NAME: Claire Drake MRN: [id number] DOB : November 02, 1940    Date of Service   03/31/2023   HPI/Events of Note   Nurse paged because patient was experiencing tachycardia.  Patient was showing a flutter with 2-1 AV conduction on the ECG.  Patient alert and oriented and denies chest pain at this time.  Chart review showed that patient was on diltiazem drip that was turned off when she converted.  Patient currently on a heparin drip.  Interventions   Patient placed back on diltiazem drip after consulting cardiology overnight.       Mert Dietrick Lamin Geradine Girt, MSN, APRN, AGACNP-BC [facility] [place] Pager: [phone]. Check Amion for Availability

## 2023-03-31 NOTE — Progress Notes (Signed)
 Ready to receive

## 2023-03-31 NOTE — H&P (Addendum)
History and Physical    Claire Drake:[id number] DOB: 1940/08/23 DOA: 03/31/2023  PCP: Enid Baas, MD  Patient coming from: home by way of pcp's office   Chief Complaint: referred from pcp for tachycardia  HPI: Claire Drake is a 82 y.o. female with medical history significant for htn, endometrial cancer, and dm, presenting with the above.  Feeling well, no recent illness, no fever or cough or n/v/d, no recent change in meds. Saw pcp today for routine visit, hr found to be in the 150s so referred here. Denies chest pain or palpitations or orthopnea. No swelling. No alcohol, drugs, tobacco. NO history cardiac problems   Review of Systems: As per HPI otherwise 10 point review of systems negative.    Past Medical History:  Diagnosis Date   Arthritis    Cancer (HCC) 08/2017   endometrial cancer; skin cancer on lip and one on head   Dysrhythmia    seeing Dr. Mariah Milling 09/07/2017   GERD (gastroesophageal reflux disease)    occasionally   Hypertension 08/2017   just recently with new diagnosis   Rectocele 2019   Uncontrolled type 2 diabetes mellitus with hyperglycemia (HCC) 08/06/2017   A1C 8.7  07/2017    Past Surgical History:  Procedure Laterality Date   ABDOMINAL HYSTERECTOMY     APPENDECTOMY  1970   BIOPSY VULVA  08/21/2022   CATARACT EXTRACTION, BILATERAL  05/12/2002   CHOLECYSTECTOMY  1978   LAPAROSCOPIC HYSTERECTOMY N/A 09/09/2017   Procedure: HYSTERECTOMY TOTAL LAPAROSCOPIC WITH REMOVAL OF OVARIES AND TUBES, SENTINEL LYMPH NODE MAPPING AND BIOPSIES;  Surgeon: Leida Lauth, MD;  Location: [facility] ORS;  Service: Gynecology;  Laterality: N/A;   PUNCH BIOPSY OF SKIN  07/23/2020       TUBAL LIGATION Bilateral 1980     reports that she has never smoked. She has never used smokeless tobacco. She reports that she does not drink alcohol and does not use drugs.  Allergies  Allergen Reactions   Seasonal Ic [Cholestatin] Other (See Comments)    Stuffy, nasal drainage     Family History  Problem Relation Age of Onset   Dementia Mother    Heart disease Father    Heart attack Father    Diabetes Maternal Grandmother    Heart attack Maternal Grandfather    Breast cancer Neg Hx     Prior to Admission medications   Medication Sig Start Date End Date Taking? Authorizing Provider  amLODipine (NORVASC) 5 MG tablet Take 1 tablet (5 mg total) by mouth daily. 01/23/20  Yes Emi Belfast, FNP  aspirin EC 81 MG tablet Take 81 mg by mouth daily.   Yes [provider]  atorvastatin (LIPITOR) 10 MG tablet TAKE 1 TABLET BY MOUTH DAILY 02/01/20  Yes Emi Belfast, FNP  lisinopril (ZESTRIL) 20 MG tablet Take 1 tablet (20 mg total) by mouth daily. 08/23/20  Yes Eustaquio Boyden, MD  metFORMIN (GLUCOPHAGE) 500 MG tablet TAKE 1 TABLET BY MOUTH DAILY WITH SUPPER 05/17/21  Yes Worthy Rancher B, FNP  PROLIA 60 MG/ML SOSY injection Inject 60 mg into the skin every 6 (six) months.   Yes [provider]  calcium elemental as carbonate (BARIATRIC TUMS ULTRA) 400 MG chewable tablet Chew 1,000 mg by mouth daily as needed for heartburn.    [provider]  clobetasol ointment (TEMOVATE) 0.05 % One applicator full to the opening of the vagina 2 to 3 times per week Patient not taking: Reported on 03/31/2023 12/25/22  Ramirez-Perez Bing, MD  diphenhydrAMINE (BENADRYL) 25 mg capsule Take 25 mg by mouth at bedtime as needed for allergies or sleep.    [provider]  Vitamin D, Ergocalciferol, (DRISDOL) 1.25 MG (50000 UNIT) CAPS capsule TAKE 1 CAPSULE BY MOUTH EVERY 7 DAYS Patient not taking: Reported on 03/31/2023 08/04/19   Emi Belfast, FNP    Physical Exam: Vitals:   03/31/23 1400 03/31/23 1419 03/31/23 1530 03/31/23 1600  BP: (!) 150/114  124/82 (!) 126/94  Pulse: (!) 154  (!) 153 (!) 153  Resp: (!) 23  15 17   Temp:  98.1 F (36.7 C)    TempSrc:  Oral    SpO2: 98%  99% 99%  Weight:      Height:        Constitutional: No acute  distress Head: Atraumatic Eyes: Conjunctiva clear ENM: Moist mucous membranes. Normal dentition.  Neck: Supple Respiratory: Clear to auscultation bilaterally, no wheezing/rales/rhonchi. Normal respiratory effort. No accessory muscle use. . Cardiovascular: tachycardia Abdomen: Non-tender, non-distended. No masses. No rebound or guarding. Positive bowel sounds. Musculoskeletal: No joint deformity upper and lower extremities. Normal ROM, no contractures. Normal muscle tone.  Skin: No rashes, lesions, or ulcers.  Extremities: No peripheral edema. Palpable peripheral pulses. Neurologic: Alert, moving all 4 extremities. Psychiatric: Normal insight and judgement.   Labs on Admission: I have personally reviewed following labs and imaging studies  CBC: Recent Labs  Lab 03/31/23 1109  WBC 5.9  HGB 14.9  HCT 44.0  MCV 98.0  PLT 216   Basic Metabolic Panel: Recent Labs  Lab 03/31/23 1109  NA 141  K 4.5  CL 104  CO2 25  GLUCOSE 120*  BUN 19  CREATININE 0.63  CALCIUM 9.8   GFR: Estimated Creatinine Clearance: 44.5 mL/min (by C-G formula based on SCr of 0.63 mg/dL). Liver Function Tests: No results for input(s): "AST", "ALT", "ALKPHOS", "BILITOT", "PROT", "ALBUMIN" in the last 168 hours. No results for input(s): "LIPASE", "AMYLASE" in the last 168 hours. No results for input(s): "AMMONIA" in the last 168 hours. Coagulation Profile: Recent Labs  Lab 03/31/23 1333  INR 1.0   Cardiac Enzymes: No results for input(s): "CKTOTAL", "CKMB", "CKMBINDEX", "TROPONINI" in the last 168 hours. BNP (last 3 results) No results for input(s): "PROBNP" in the last 8760 hours. HbA1C: No results for input(s): "HGBA1C" in the last 72 hours. CBG: No results for input(s): "GLUCAP" in the last 168 hours. Lipid Profile: No results for input(s): "CHOL", "HDL", "LDLCALC", "TRIG", "CHOLHDL", "LDLDIRECT" in the last 72 hours. Thyroid Function Tests: Recent Labs    03/31/23 1109  TSH 1.850    Anemia Panel: No results for input(s): "VITAMINB12", "FOLATE", "FERRITIN", "TIBC", "IRON", "RETICCTPCT" in the last 72 hours. Urine analysis:    Component Value Date/Time   COLORURINE YELLOW (A) 09/01/2017 1034   APPEARANCEUR CLEAR (A) 09/01/2017 1034   LABSPEC 1.018 09/01/2017 1034   PHURINE 5.0 09/01/2017 1034   GLUCOSEU NEGATIVE 09/01/2017 1034   GLUCOSEU NEGATIVE 07/29/2017 1207   HGBUR NEGATIVE 09/01/2017 1034   BILIRUBINUR Negative 05/16/2022 1520   KETONESUR 20 (A) 09/01/2017 1034   PROTEINUR Negative 05/16/2022 1520   PROTEINUR 30 (A) 09/01/2017 1034   UROBILINOGEN 0.2 05/16/2022 1520   UROBILINOGEN 0.2 07/29/2017 1207   NITRITE Negative 05/16/2022 1520   NITRITE NEGATIVE 09/01/2017 1034   LEUKOCYTESUR Small (1+) (A) 05/16/2022 1520    Radiological Exams on Admission: DG Chest 2 View  Result Date: 03/31/2023 CLINICAL DATA:  Tachycardia. EXAM: CHEST - 2  VIEW COMPARISON:  Chest radiographs 09/01/2017 FINDINGS: Cardiac silhouette and mediastinal contours are within limits. Moderate to high-grade atherosclerotic calcification is again seen within the aortic arch. The lungs are clear. No pleural effusion or pneumothorax. Mild flattening of the diaphragms and hyperinflation is unchanged. Moderate multilevel degenerative disc space narrowing of the visualized cervical and thoracic spine. Minimal anterior height loss of a midthoracic vertebral body is unchanged from prior. Multiple EKG leads overlie the chest. IMPRESSION: 1. No active cardiopulmonary disease. 2. Mild chronic hyperinflation, unchanged. Electronically Signed   By: Neita Garnet M.D.   On: 03/31/2023 15:44    EKG: Independently reviewed. Flutter w/ tachycardia  Assessment/Plan Principal Problem:   Atrial flutter with rapid ventricular response (HCC) Active Problems:   Uncontrolled type 2 diabetes mellitus with hyperglycemia (HCC)   History of uterine cancer   Essential hypertension   # A-flutter with  RVR Asymptomatic. TSH wnl. No signs chf or ischemia. No electrolyte derangement or toxic habits.  - cardiology following - currently on dilt gtt. Tolerating but remains tachycardic - TTE pending - for TEE cardioversion tomorrow - maintain heparin gtt for now  # T2DM Glucose wno here - will monitor daily fasting sugars, hold home metformin  # HTN Here bp appropriate - holding home amlod and lisinopril while on dilt gtt - holding home asa and statin for the time being as well, will plan on discontinuing aspirin at dc assuming we start anticoagulation  # History endometrial cancer Treated, now periodic surveillance  DVT prophylaxis: IV heparin Code Status: DNR confirmed with patient and son  Family Communication: son updated @ bedside 11/19 Consults called: cardiology   Level of care: Progressive Status is: Inpatient Remains inpatient appropriate because: severity of illness    Silvano Bilis MD [facility] Pager [phone]  If 7PM-7AM, please contact night-coverage [email or web address] Password [facility]  03/31/2023, 4:42 PM

## 2023-03-31 NOTE — Consult Note (Signed)
PHARMACY - ANTICOAGULATION CONSULT NOTE  Pharmacy Consult for Heparin  Indication: atrial fibrillation  Allergies  Allergen Reactions   Seasonal Ic [Cholestatin] Other (See Comments)    Stuffy, nasal drainage   Patient Measurements: Height: 5' (152.4 cm) Weight: 62.7 kg (138 lb 4.8 oz) IBW/kg (Calculated) : 45.5 Heparin Dosing Weight: 59 kg   Vital Signs: Temp: 98 F (36.7 C) (11/19 2159) Temp Source: Oral (11/19 1419) BP: 120/83 (11/19 2245) Pulse Rate: 48 (11/19 2245)  Labs: Recent Labs    03/31/23 1109 03/31/23 1333 03/31/23 2227  HGB 14.9  --   --   HCT 44.0  --   --   PLT 216  --   --   APTT  --  30  --   LABPROT  --  13.0  --   INR  --  1.0  --   HEPARINUNFRC  --   --  0.60  CREATININE 0.63  --   --   TROPONINIHS 9 11  --    Estimated Creatinine Clearance: 44.1 mL/min (by C-G formula based on SCr of 0.63 mg/dL).  Medical History: Past Medical History:  Diagnosis Date   Arthritis    Cancer (HCC) 08/2017   endometrial cancer; skin cancer on lip and one on head   Dysrhythmia    seeing Dr. Mariah Milling 09/07/2017   GERD (gastroesophageal reflux disease)    occasionally   Hypertension 08/2017   just recently with new diagnosis   Rectocele 2019   Uncontrolled type 2 diabetes mellitus with hyperglycemia (HCC) 08/06/2017   A1C 8.7  07/2017   Medications:  No PTA anticoagulation   Assessment: Claire Drake is an 82 year old female that was found to be in atrial flutter during her PCP appointment today. HR currently in the 150s. Patient is not on any anticoagulation at home. Pharmacy has been consulted for management of a heparin infusion.   Baseline labs: Hgb 14.9, PLT 216, aPTT and PT/INR pending   Goal of Therapy:  Heparin level 0.3-0.7 units/ml Monitor platelets by anticoagulation protocol: Yes   Plan:  11/19:  HL @ 2227 = 0.60, therapeutic X 1 - Will continue pt on current rate and recheck HL in 8 hrs  Brandye Inthavong D, PharmD 03/31/2023 10:55 PM

## 2023-03-31 NOTE — ED Notes (Signed)
Informed provider that patient is now in NSR.

## 2023-03-31 NOTE — ED Provider Notes (Signed)
[facility] Provider Note    Event Date/Time   First MD Initiated Contact with Patient 03/31/23 1108     (approximate)   History   Tachycardia   HPI {Remember to add pertinent medical, surgical, social, and/or OB history to HPI:1} Claire Drake is a 82 y.o. female with a history of uncontrolled diabetes, hypertension "irregular heart rhythm" (note from 2019 reports a history of A-fib, does not seem to carry well to in her primary care or hospital records though? - mention may have been misinterpretted as Afib prior?)  Patient reports she went to the doctor for a routine examination.  She was found to have a higher heart rate.  She was referred her by her doctor because the concern of the high heart rate.  She has not experienced any symptoms no chest pain no shortness of breath no weakness she has been feeling in her normal health.  She does relate recent increase in life stress with one of her children who is moved back into the house and has been having significant battle with alcohol use disorder.       Physical Exam   Triage Vital Signs: ED Triage Vitals  Encounter Vitals Group     BP 03/31/23 1105 (!) 150/114     Systolic BP Percentile --      Diastolic BP Percentile --      Pulse Rate 03/31/23 1105 (!) 153     Resp 03/31/23 1105 20     Temp 03/31/23 1105 97.8 F (36.6 C)     Temp Source 03/31/23 1105 Oral     SpO2 03/31/23 1105 100 %     Weight 03/31/23 1106 141 lb 1.5 oz (64 kg)     Height 03/31/23 1106 5' (1.524 m)     Head Circumference --      Peak Flow --      Pain Score 03/31/23 1105 0     Pain Loc --      Pain Education --      Exclude from Growth Chart --     Most recent vital signs: Vitals:   03/31/23 1105  BP: (!) 150/114  Pulse: (!) 153  Resp: 20  Temp: 97.8 F (36.6 C)  SpO2: 100%     General: Awake, no distress.  CV:  Good peripheral perfusion.  Tachycardia.  Normal tones. Resp:  Normal effort.  Clear  bilateral Abd:  No distention.  Soft nontender nondistended Other:  No lower extremity edema   ED Results / Procedures / Treatments   Labs (all labs ordered are listed, but only abnormal results are displayed) Labs Reviewed  BASIC METABOLIC PANEL  CBC  TROPONIN I (HIGH SENSITIVITY)     EKG  And interpreted by me at 1105 heart rate 150 QRS 80 QTc 500 Atrial flutter with 2-1 conduction.  P waves visible in V1 suggestive of a flutter.  No evidence of acute ischemia   RADIOLOGY *** {USE THE WORD "INTERPRETED"!! You MUST document your own interpretation of imaging, as well as the fact that you reviewed the radiologist's report!:1}   PROCEDURES:  Critical Care performed: {CriticalCareYesNo:19197::"Yes, see critical care procedure note(s)","No"}  Procedures   MEDICATIONS ORDERED IN ED: Medications - No data to display   IMPRESSION / MDM / ASSESSMENT AND PLAN / ED COURSE  I reviewed the triage vital signs and the nursing notes.  Differential diagnosis includes, but is not limited to, tacky dysrhythmia, SVT, atrial flutter, A-fib, etc.  Patient denies any symptoms.  She has no symptoms at all and appears to be asymptomatic.  She is not anticoagulated.  CBC is interpreted as normal.  Denies history of known heart disease.  As she is asymptomatic, consulted with her cardiologist Dr. ***, Who is recommendation ***  Patient's presentation is most consistent with {EM COPA:27473}  *** {If the patient is on the monitor, remove the brackets and asterisks on the sentence below and remember to document it as a Procedure as well. Otherwise delete the sentence below:1} {**The patient is on the cardiac monitor to evaluate for evidence of arrhythmia and/or significant heart rate changes.**} {Remember to include, when applicable, any/all of the following data: independent review of imaging independent review of labs (comment specifically on pertinent  positives and negatives) review of specific prior hospitalizations, PCP/specialist notes, etc. discuss meds given and prescribed document any discussion with consultants (including hospitalists) any clinical decision tools you used and why (PECARN, NEXUS, etc.) did you consider admitting the patient? document social determinants of health affecting patient's care (homelessness, inability to follow up in a timely fashion, etc) document any pre-existing conditions increasing risk on current visit (e.g. diabetes and HTN increasing danger of high-risk chest pain/ACS) describes what meds you gave (especially parenteral) and why any other interventions?:1}     FINAL CLINICAL IMPRESSION(S) / ED DIAGNOSES   Final diagnoses:  None     Rx / DC Orders   ED Discharge Orders     None        Note:  This document was prepared using Dragon voice recognition software and may include unintentional dictation errors.

## 2023-03-31 NOTE — Consult Note (Signed)
PHARMACY - ANTICOAGULATION CONSULT NOTE  Pharmacy Consult for Heparin  Indication: atrial fibrillation  Allergies  Allergen Reactions   Seasonal Ic [Cholestatin] Other (See Comments)    Stuffy, nasal drainage   Patient Measurements: Height: 5' (152.4 cm) Weight: 64 kg (141 lb 1.5 oz) IBW/kg (Calculated) : 45.5 Heparin Dosing Weight: 59 kg   Vital Signs: Temp: 97.8 F (36.6 C) (11/19 1105) Temp Source: Oral (11/19 1105) BP: 134/87 (11/19 1230) Pulse Rate: 151 (11/19 1230)  Labs: Recent Labs    03/31/23 1109  HGB 14.9  HCT 44.0  PLT 216  CREATININE 0.63  TROPONINIHS 9   Estimated Creatinine Clearance: 44.5 mL/min (by C-G formula based on SCr of 0.63 mg/dL).  Medical History: Past Medical History:  Diagnosis Date   Arthritis    Cancer (HCC) 08/2017   endometrial cancer; skin cancer on lip and one on head   Dysrhythmia    seeing Dr. Mariah Milling 09/07/2017   GERD (gastroesophageal reflux disease)    occasionally   Hypertension 08/2017   just recently with new diagnosis   Rectocele 2019   Uncontrolled type 2 diabetes mellitus with hyperglycemia (HCC) 08/06/2017   A1C 8.7  07/2017   Medications:  No PTA anticoagulation   Assessment: Claire Drake is an 82 year old female that was found to be in atrial flutter during her PCP appointment today. HR currently in the 150s. Patient is not on any anticoagulation at home. Pharmacy has been consulted for management of a heparin infusion.   Baseline labs: Hgb 14.9, PLT 216, aPTT and PT/INR pending   Goal of Therapy:  Heparin level 0.3-0.7 units/ml Monitor platelets by anticoagulation protocol: Yes   Plan:  Give 3500 unit bolus x 1  Start heparin infusion at 900 units/hr  Check HL 8 hours after initiation of infusion  Monitor CBC daily while on heparin   [name], PharmD Pharmacy Resident  03/31/2023 1:27 PM

## 2023-04-01 ENCOUNTER — Encounter
Admission: EM | Disposition: A | Discharge status: Home / Self Care | Payer: Self-pay | Source: Home / Self Care | Attending: Emergency Medicine

## 2023-04-01 ENCOUNTER — Inpatient Hospital Stay: Payer: Medicare PPO | Admitting: Certified Registered"

## 2023-04-01 ENCOUNTER — Inpatient Hospital Stay (HOSPITAL_BASED_OUTPATIENT_CLINIC_OR_DEPARTMENT_OTHER)
Admit: 2023-04-01 | Discharge: 2023-04-01 | Disposition: A | Discharge status: Home / Self Care | Payer: Medicare PPO | Attending: Cardiology | Admitting: Cardiology

## 2023-04-01 ENCOUNTER — Other Ambulatory Visit (HOSPITAL_COMMUNITY): Payer: Self-pay

## 2023-04-01 ENCOUNTER — Telehealth (HOSPITAL_COMMUNITY): Payer: Self-pay | Admitting: Pharmacy Technician

## 2023-04-01 ENCOUNTER — Telehealth: Payer: Self-pay

## 2023-04-01 DIAGNOSIS — R0602 Shortness of breath: Secondary | ICD-10-CM

## 2023-04-01 DIAGNOSIS — I4892 Unspecified atrial flutter: Secondary | ICD-10-CM

## 2023-04-01 DIAGNOSIS — I340 Nonrheumatic mitral (valve) insufficiency: Secondary | ICD-10-CM

## 2023-04-01 DIAGNOSIS — I361 Nonrheumatic tricuspid (valve) insufficiency: Secondary | ICD-10-CM

## 2023-04-01 HISTORY — PX: CARDIOVERSION: SHX1299

## 2023-04-01 HISTORY — PX: INTRAOPERATIVE TRANSESOPHAGEAL ECHOCARDIOGRAM: SHX5062

## 2023-04-01 LAB — HEPARIN LEVEL (UNFRACTIONATED): Heparin Unfractionated: 0.38 [IU]/mL (ref 0.30–0.70)

## 2023-04-01 LAB — CBC
HCT: 39.6 % (ref 36.0–46.0)
Hemoglobin: 13.6 g/dL (ref 12.0–15.0)
MCH: 32.4 pg (ref 26.0–34.0)
MCHC: 34.3 g/dL (ref 30.0–36.0)
MCV: 94.3 fL (ref 80.0–100.0)
Platelets: 202 10*3/uL (ref 150–400)
RBC: 4.2 MIL/uL (ref 3.87–5.11)
RDW: 12.5 % (ref 11.5–15.5)
WBC: 5.1 10*3/uL (ref 4.0–10.5)
nRBC: 0 % (ref 0.0–0.2)

## 2023-04-01 LAB — BASIC METABOLIC PANEL
Anion gap: 7 (ref 5–15)
BUN: 15 mg/dL (ref 8–23)
CO2: 23 mmol/L (ref 22–32)
Calcium: 8.6 mg/dL — ABNORMAL LOW (ref 8.9–10.3)
Chloride: 106 mmol/L (ref 98–111)
Creatinine, Ser: 0.62 mg/dL (ref 0.44–1.00)
GFR, Estimated: >60 mL/min (ref >60)
Glucose, Bld: 126 mg/dL — ABNORMAL HIGH (ref 70–99)
Potassium: 3.6 mmol/L (ref 3.5–5.1)
Sodium: 136 mmol/L (ref 135–145)

## 2023-04-01 LAB — ECHOCARDIOGRAM COMPLETE
Height: 60 in
MV M vel: 4.71 m/s
MV Peak grad: 88.7 mm[Hg]
S' Lateral: 2.7 cm
Weight: 2257.51 [oz_av]

## 2023-04-01 LAB — ECHO TEE

## 2023-04-01 SURGERY — INTRAOPERATIVE TRANSESOPHAGEAL ECHOCARDIOGRAM
Anesthesia: General

## 2023-04-01 MED ORDER — PROPOFOL 10 MG/ML IV BOLUS
INTRAVENOUS | Status: DC | PRN
  Administered 2023-04-01 (×2): 30 mg via INTRAVENOUS
  Administered 2023-04-01 (×4): 20 mg via INTRAVENOUS

## 2023-04-01 MED ORDER — PROPOFOL 10 MG/ML IV BOLUS
INTRAVENOUS | Status: AC
  Filled 2023-04-01: qty 20

## 2023-04-01 MED ORDER — APIXABAN 5 MG PO TABS: 5.0000 mg | ORAL_TABLET | Freq: Two times a day (BID) | ORAL | 1 refills | Status: AC

## 2023-04-01 MED ORDER — METOPROLOL SUCCINATE ER 25 MG PO TB24: 25.0000 mg | ORAL_TABLET | Freq: Two times a day (BID) | ORAL | 1 refills | Status: AC

## 2023-04-01 MED ORDER — METOPROLOL SUCCINATE ER 25 MG PO TB24
25.0000 mg | ORAL_TABLET | Freq: Two times a day (BID) | ORAL | Status: DC
  Administered 2023-04-01: 25 mg via ORAL
  Filled 2023-04-01 (×2): qty 1

## 2023-04-01 MED ORDER — BUTAMBEN-TETRACAINE-BENZOCAINE 2-2-14 % EX AERO
INHALATION_SPRAY | CUTANEOUS | Status: AC
  Filled 2023-04-01: qty 5

## 2023-04-01 MED ORDER — BUTAMBEN-TETRACAINE-BENZOCAINE 2-2-14 % EX AERO
INHALATION_SPRAY | CUTANEOUS | Status: DC | PRN
  Administered 2023-04-01: 2 via TOPICAL

## 2023-04-01 MED ORDER — LIDOCAINE VISCOUS HCL 2 % MT SOLN
OROMUCOSAL | Status: AC
  Filled 2023-04-01: qty 15

## 2023-04-01 MED ORDER — SODIUM CHLORIDE 0.9 % IV SOLN: INTRAVENOUS | Status: DC | PRN

## 2023-04-01 MED ORDER — APIXABAN 5 MG PO TABS
5.0000 mg | ORAL_TABLET | Freq: Two times a day (BID) | ORAL | Status: DC
  Administered 2023-04-01: 5 mg via ORAL
  Filled 2023-04-01: qty 1

## 2023-04-01 MED ORDER — PROPOFOL 1000 MG/100ML IV EMUL
INTRAVENOUS | Status: AC
  Filled 2023-04-01: qty 100

## 2023-04-01 NOTE — Telephone Encounter (Signed)
14 day Zio monitor placed as requested by [name], NP

## 2023-04-01 NOTE — Care Management CC44 (Signed)
Condition Code 44 Documentation Completed  Patient Details  Name: DALAYA PAGNI MRN: [id number] Date of Birth: 1941/02/20   Condition Code 44 given:  Yes Patient signature on Condition Code 44 notice:  Yes Documentation of 2 MD's agreement:  Yes Code 44 added to claim:  Yes    Truddie Hidden, RN 04/01/2023, 3:46 PM

## 2023-04-01 NOTE — Progress Notes (Signed)
*  PRELIMINARY RESULTS* Echocardiogram Echocardiogram Transesophageal has been performed.  Claire Drake 04/01/2023, 8:36 AM

## 2023-04-01 NOTE — Telephone Encounter (Addendum)
Patient Product/process development scientist completed.    The patient is insured through [employer]. Patient has Medicare and is not eligible for a copay card, but may be able to apply for patient assistance, if available.    Ran test claim for Eliquis 5 mg and the current 30 day co-pay is $40.00.  Ran test claim for Xarelto 20 mg and the current 30 day co-pay is $40.00.  This test claim was processed through [facility]- copay amounts may vary at other pharmacies due to pharmacy/plan contracts, or as the patient moves through the different stages of their insurance plan.     Roland Earl, CPHT Pharmacy Technician III Certified Patient Advocate [facility] Pharmacy Patient Advocate Team Direct Number: [phone]  Fax: [phone]

## 2023-04-01 NOTE — Anesthesia Postprocedure Evaluation (Signed)
Anesthesia Post Note  Patient: Claire Drake  Procedure(s) Performed: INTRAOPERATIVE TRANSESOPHAGEAL ECHOCARDIOGRAM CARDIOVERSION  Patient location during evaluation: Specials Recovery Anesthesia Type: General Level of consciousness: awake and alert Pain management: pain level controlled Vital Signs Assessment: post-procedure vital signs reviewed and stable Respiratory status: spontaneous breathing, nonlabored ventilation, respiratory function stable and patient connected to nasal cannula oxygen Cardiovascular status: blood pressure returned to baseline and stable Postop Assessment: no apparent nausea or vomiting Anesthetic complications: no   No notable events documented.   Last Vitals:  Vitals:   04/01/23 0942 04/01/23 0953  BP: 104/67 (!) 112/59  Pulse: (!) 109   Resp: 18   Temp: 36.5 C   SpO2: 97%     Last Pain:  Vitals:   04/01/23 0942  TempSrc: Oral  PainSc:                  Cleda Mccreedy Rashidi Loh

## 2023-04-01 NOTE — Progress Notes (Signed)
Transesophageal Echocardiogram :  Indication: Atrial flutter with RVR Requesting/ordering  physician: Dr. Ashok Pall  Procedure: Benzocaine spray x2 and 2 mls x 2 of viscous lidocaine were given orally to provide local anesthesia to the oropharynx. The patient was positioned supine on the left side, bite block provided. The patient was moderately sedated with the doses of versed and fentanyl as detailed below.  Using digital technique an omniplane probe was advanced into the distal esophagus without incident.   Moderate sedation: 1. Sedation used: Propofol per anesthesia  See report in EPIC  for complete details: In brief, transgastric imaging revealed normal LV function with no RWMAs and no mural apical thrombus.  .  Estimated ejection fraction was 55%.  Right sided cardiac chambers were normal with no evidence of pulmonary hypertension.  Imaging of the septum showed no ASD or VSD Bubble study was negative for shunt 2D and color flow confirmed no PFO  The LA was well visualized in orthogonal views.  There was no spontaneous contrast and no thrombus in the LA and LA appendage   The descending thoracic aorta had no  mural aortic debris with no evidence of aneurysmal dilation or disection  Cardioversion to follow   Julien Nordmann 04/01/2023 11:33 AM

## 2023-04-01 NOTE — Anesthesia Preprocedure Evaluation (Signed)
Anesthesia Evaluation  Patient identified by MRN, date of birth, ID band Patient awake    Reviewed: Allergy & Precautions, NPO status , Patient's Chart, lab work & pertinent test results  History of Anesthesia Complications Negative for: history of anesthetic complications  Airway Mallampati: III  TM Distance: <3 FB Neck ROM: full    Dental  (+) Chipped   Pulmonary neg pulmonary ROS, neg shortness of breath   Pulmonary exam normal        Cardiovascular Exercise Tolerance: Good hypertension, + dysrhythmias Atrial Fibrillation  Rhythm:irregular Rate:Tachycardia     Neuro/Psych negative neurological ROS  negative psych ROS   GI/Hepatic Neg liver ROS,GERD  Controlled,,  Endo/Other  diabetes, Type 2    Renal/GU negative Renal ROS  negative genitourinary   Musculoskeletal   Abdominal   Peds  Hematology negative hematology ROS (+)   Anesthesia Other Findings Past Medical History: No date: Arthritis 08/2017: Cancer (HCC)     Comment:  endometrial cancer; skin cancer on lip and one on head No date: Dysrhythmia     Comment:  seeing Dr. Mariah Milling 09/07/2017 No date: GERD (gastroesophageal reflux disease)     Comment:  occasionally 08/2017: Hypertension     Comment:  just recently with new diagnosis 2019: Rectocele 08/06/2017: Uncontrolled type 2 diabetes mellitus with hyperglycemia  (HCC)     Comment:  A1C 8.7  07/2017  Past Surgical History: No date: ABDOMINAL HYSTERECTOMY 1970: APPENDECTOMY 08/21/2022: BIOPSY VULVA 05/12/2002: CATARACT EXTRACTION, BILATERAL 1978: CHOLECYSTECTOMY 09/09/2017: LAPAROSCOPIC HYSTERECTOMY; N/A     Comment:  Procedure: HYSTERECTOMY TOTAL LAPAROSCOPIC WITH REMOVAL               OF OVARIES AND TUBES, SENTINEL LYMPH NODE MAPPING AND               BIOPSIES;  Surgeon: Leida Lauth, MD;  Location: [facility]              ORS;  Service: Gynecology;  Laterality: N/A; 07/23/2020: PUNCH BIOPSY OF  SKIN     Comment:    1980: TUBAL LIGATION; Bilateral  BMI    Body Mass Index: 27.01 kg/m      Reproductive/Obstetrics negative OB ROS                             Anesthesia Physical Anesthesia Plan  ASA: 4  Anesthesia Plan: General   Post-op Pain Management:    Induction: Intravenous  PONV Risk Score and Plan: Propofol infusion and TIVA  Airway Management Planned: Natural Airway and Nasal Cannula  Additional Equipment:   Intra-op Plan:   Post-operative Plan:   Informed Consent: I have reviewed the patients History and Physical, chart, labs and discussed the procedure including the risks, benefits and alternatives for the proposed anesthesia with the patient or authorized representative who has indicated his/her understanding and acceptance.     Dental Advisory Given  Plan Discussed with: Anesthesiologist, CRNA and Surgeon  Anesthesia Plan Comments: (Patient consented for risks of anesthesia including but not limited to:  - adverse reactions to medications - risk of airway placement if required - damage to eyes, teeth, lips or other oral mucosa - nerve damage due to positioning  - sore throat or hoarseness - Damage to heart, brain, nerves, lungs, other parts of body or loss of life  Patient voiced understanding and assent.)       Anesthesia Quick Evaluation

## 2023-04-01 NOTE — Progress Notes (Signed)
Patient's heart rate is sustaining at 150 while receiving the maximum dose of intravenous cardizem. Notified Jawo NP, no new orders at this time.

## 2023-04-01 NOTE — Progress Notes (Signed)
   04/01/23 0000  Assess: MEWS Score  BP 117/86  MAP (mmHg) 96  Pulse Rate (!) 57  ECG Heart Rate (!) 119  Resp 17  SpO2 97 %  Assess: MEWS Score  MEWS Temp 0  MEWS Systolic 0  MEWS Pulse 2  MEWS RR 0  MEWS LOC 0  MEWS Score 2  MEWS Score Color Yellow  Assess: if the MEWS score is Yellow or Red  Were vital signs accurate and taken at a resting state? Yes  Does the patient meet 2 or more of the SIRS criteria? No  MEWS guidelines implemented  No, previously yellow, continue vital signs every 4 hours  Assess: SIRS CRITERIA  SIRS Temperature  0  SIRS Pulse 1  SIRS Respirations  0  SIRS WBC 0  SIRS Score Sum  1   Patient admitted for [facility] with rvr. Patient is currently on a cardizem drip.

## 2023-04-01 NOTE — Transfer of Care (Signed)
Immediate Anesthesia Transfer of Care Note  Patient: Claire Drake  Procedure(s) Performed: INTRAOPERATIVE TRANSESOPHAGEAL ECHOCARDIOGRAM CARDIOVERSION  Patient Location: PACU and Cath Lab  Anesthesia Type:General  Level of Consciousness: awake  Airway & Oxygen Therapy: Patient Spontanous Breathing and Patient connected to nasal cannula oxygen  Post-op Assessment: Report given to RN  Post vital signs: stable  Last Vitals:  Vitals Value Taken Time  BP 99/63 04/01/23 0840  Temp    Pulse 65 04/01/23 0842  Resp 20 04/01/23 0842  SpO2 95 % 04/01/23 0842  Vitals shown include unfiled device data.  Last Pain:  Vitals:   04/01/23 0717  TempSrc: Oral  PainSc: 0-No pain         Complications: No notable events documented.

## 2023-04-01 NOTE — Consult Note (Signed)
PHARMACY - ANTICOAGULATION CONSULT NOTE  Pharmacy Consult for Heparin  Indication: atrial fibrillation  Allergies  Allergen Reactions   Seasonal Ic [Cholestatin] Other (See Comments)    Stuffy, nasal drainage   Patient Measurements: Height: 5' (152.4 cm) Weight: 62.7 kg (138 lb 4.8 oz) IBW/kg (Calculated) : 45.5 Heparin Dosing Weight: 59 kg   Vital Signs: Temp: 98.1 F (36.7 C) (11/20 0300) Temp Source: Oral (11/20 0300) BP: 130/94 (11/20 0600) Pulse Rate: 152 (11/20 0600)  Labs: Recent Labs    03/31/23 1109 03/31/23 1333 03/31/23 2227 04/01/23 0423 04/01/23 0553  HGB 14.9  --   --  13.6  --   HCT 44.0  --   --  39.6  --   PLT 216  --   --  202  --   APTT  --  30  --   --   --   LABPROT  --  13.0  --   --   --   INR  --  1.0  --   --   --   HEPARINUNFRC  --   --  0.60  --  0.38  CREATININE 0.63  --   --  0.62  --   TROPONINIHS 9 11  --   --   --    Estimated Creatinine Clearance: 44.1 mL/min (by C-G formula based on SCr of 0.62 mg/dL).  Medical History: Past Medical History:  Diagnosis Date   Arthritis    Cancer (HCC) 08/2017   endometrial cancer; skin cancer on lip and one on head   Dysrhythmia    seeing Dr. Mariah Milling 09/07/2017   GERD (gastroesophageal reflux disease)    occasionally   Hypertension 08/2017   just recently with new diagnosis   Rectocele 2019   Uncontrolled type 2 diabetes mellitus with hyperglycemia (HCC) 08/06/2017   A1C 8.7  07/2017   Medications:  No PTA anticoagulation   Assessment: Claire Drake is an 82 year old female that was found to be in atrial flutter during her PCP appointment today. HR currently in the 150s. Patient is not on any anticoagulation at home. Pharmacy has been consulted for management of a heparin infusion.   Baseline labs: Hgb 14.9, PLT 216, aPTT and PT/INR pending   Goal of Therapy:  Heparin level 0.3-0.7 units/ml Monitor platelets by anticoagulation protocol: Yes   Plan:  11/20:  HL @ 0553 = 0.38, therapeutic  X 2 - Will continue pt on current rate and recheck HL on 11/21 with AM labs  Melina Mosteller D, PharmD 04/01/2023 6:16 AM

## 2023-04-01 NOTE — Plan of Care (Signed)
?  Problem: Clinical Measurements: ?Goal: Ability to maintain clinical measurements within normal limits will improve ?Outcome: Not Progressing ?  ?

## 2023-04-01 NOTE — Progress Notes (Signed)
  Patient Name: Claire Drake Date of Encounter: 04/01/2023  Primary Cardiologist: None Electrophysiologist: None  Interval Summary   Patient converted to sinus and then back to flutter overnight She is feeling well this AM, just finished TEE/DCCV  Vital Signs    Vitals:   04/01/23 0829 04/01/23 0830 04/01/23 0847 04/01/23 0900  BP:   102/88 (!) 96/38  Pulse: 100 97 64 60  Resp: (!) 23  (!) 22 16  Temp:      TempSrc:      SpO2: 93% 94% 94% 96%  Weight:      Height:        Intake/Output Summary (Last 24 hours) at 04/01/2023 0906 Last data filed at 04/01/2023 0654 Gross per 24 hour  Intake 241.48 ml  Output --  Net 241.48 ml   Filed Weights   03/31/23 1106 03/31/23 2200  Weight: 64 kg 62.7 kg    Physical Exam    GEN- The patient is well appearing, alert and oriented x 3 today.   Lungs- Clear to ausculation bilaterally, normal work of breathing Cardiac- Regular rate and rhythm, no murmurs, rubs or gallops GI- soft, NT, ND, + BS Extremities- no clubbing or cyanosis. No edema  Telemetry    Aflutter at 150s pre-DCCV Sinus rhythm, 60s since (personally reviewed)  Hospital Course    Claire Drake is a 82 y.o. female with a history of HTN, osteoporosis, pre-diabetes, hyperparathyroidism admitted for 2:1 atrial flutter with RVR.  Her ventricular rate briefly lowered 11/19 evening, but then increased to 150s overnight.   Assessment & Plan    #) Aflutter Presents to ER from PCP office after found to be tachycardic. She is asymptomatic of episode, unclear when started. TTE completed, prelim is near-normal LVEF. Awaiting final read She is now s/p TEE/DCCV with return to sinus rhythm Transition heparin to eliquis 5mg  BID Start 25mg  toprol BID 2 week Zio at discharge    #) HTN Cont home meds   OK to discharge if maintains sinus rhythm Follow-up appt 1/6 with NP Aahna Rossa scheduled    For questions or updates, please contact [facility] Please consult  [email or web address] for contact info under Cardiology/STEMI.  Signed, [name], NP  04/01/2023, 9:06 AM

## 2023-04-01 NOTE — Plan of Care (Signed)

## 2023-04-01 NOTE — Discharge Summary (Signed)
Claire Drake EXB:[id number] DOB: 03/17/1941 DOA: 03/31/2023  PCP: Enid Baas, MD  Admit date: 03/31/2023 Discharge date: 04/01/2023  Time spent: 35 minutes  Recommendations for Outpatient Follow-up:  Pcp f/u, attention to BP at f/u EP f/u 1/6     Discharge Diagnoses:  Principal Problem:   Atrial flutter with rapid ventricular response (HCC) Active Problems:   Uncontrolled type 2 diabetes mellitus with hyperglycemia (HCC)   History of uterine cancer   Essential hypertension   SOB (shortness of breath)   Discharge Condition: stable  Diet recommendation: heart healthy  Filed Weights   03/31/23 1106 03/31/23 2200  Weight: 64 kg 62.7 kg    History of present illness:  [name] is a 82 y.o. female with medical history significant for htn, endometrial cancer, and dm, presenting with the above.   Feeling well, no recent illness, no fever or cough or n/v/d, no recent change in meds. Saw pcp today for routine visit, hr found to be in the 150s so referred here. Denies chest pain or palpitations or orthopnea. No swelling. No alcohol, drugs, tobacco. NO history cardiac problems  Hospital Course:  Patient presents referred from PCP's office where she presented for a routine physical and was found to have tachycardia. Turns out this is a-flutter with rvr. It's asymptomatic. TTE performed with mod MR, no other significant findings. Patient started on dilt gtt which she tolerated but not effective in terminating her flutter. TEE cardioversion performed on 11/20 and was successful. Patient will be discharged home on metoprolol and apixaban. EP will mail her a ziopatch and has scheduled f/u on January 6th. Advise PCP f/u, attention to BP at f/u as home BP meds have been held in the setting of soft BPs here. Bleeding precautions reviewed.  Procedures: TEE cardioversion   Consultations: cardiology  Discharge Exam: Vitals:   04/01/23 0953 04/01/23 1224  BP: (!) 112/59 104/67   Pulse:  (!) 58  Resp:    Temp:  (!) 97.4 F (36.3 C)  SpO2:  96%    General: NAD Cardiovascular: RRR, soft systolic murmur Respiratory: CTAB Ext: warm  Discharge Instructions   Discharge Instructions     Diet - low sodium heart healthy   Complete by: As directed    Increase activity slowly   Complete by: As directed       Allergies as of 04/01/2023       Reactions   Seasonal Ic [cholestatin] Other (See Comments)   Stuffy, nasal drainage        Medication List     STOP taking these medications    amLODipine 5 MG tablet Commonly known as: NORVASC   aspirin EC 81 MG tablet   lisinopril 20 MG tablet Commonly known as: ZESTRIL       TAKE these medications    apixaban 5 MG Tabs tablet Commonly known as: ELIQUIS Take 1 tablet (5 mg total) by mouth 2 (two) times daily.   atorvastatin 10 MG tablet Commonly known as: LIPITOR TAKE 1 TABLET BY MOUTH DAILY   calcium elemental as carbonate 400 MG chewable tablet Commonly known as: BARIATRIC TUMS ULTRA Chew 1,000 mg by mouth daily as needed for heartburn.   clobetasol ointment 0.05 % Commonly known as: TEMOVATE One applicator full to the opening of the vagina 2 to 3 times per week   diphenhydrAMINE 25 mg capsule Commonly known as: BENADRYL Take 25 mg by mouth at bedtime as needed for allergies or sleep.   metFORMIN 500  MG tablet Commonly known as: GLUCOPHAGE TAKE 1 TABLET BY MOUTH DAILY WITH SUPPER   metoprolol succinate 25 MG 24 hr tablet Commonly known as: TOPROL-XL Take 1 tablet (25 mg total) by mouth 2 (two) times daily.   Prolia 60 MG/ML Sosy injection Generic drug: denosumab Inject 60 mg into the skin every 6 (six) months.   Vitamin D (Ergocalciferol) 1.25 MG (50000 UNIT) Caps capsule Commonly known as: DRISDOL TAKE 1 CAPSULE BY MOUTH EVERY 7 DAYS       Allergies  Allergen Reactions   Seasonal Ic [Cholestatin] Other (See Comments)    Stuffy, nasal drainage    Follow-up  Information     Enid Baas, MD Follow up.   Specialty: Internal Medicine Contact information: [address] [place] [address] [phone]         [name], NP Follow up.   Specialty: Cardiology Why: call to confirm appointment on January 6th, and this would the the office you would contact if any issues with the heart monitor. Contact information: [address] [place] [address] [phone]                  The results of significant diagnostics from this hospitalization (including imaging, microbiology, ancillary and laboratory) are listed below for reference.    Significant Diagnostic Studies: ECHOCARDIOGRAM COMPLETE  Result Date: 04/01/2023    ECHOCARDIOGRAM REPORT   Patient Name:   Claire Drake Date of Exam: 03/31/2023 Medical Rec #:  [id number]   Height:       60.0 in Accession #:    [id number]  Weight:       141.1 lb Date of Birth:  Jul 03, 1940    BSA:          1.609 m Patient Age:    82 years    BP:           81/70 mmHg Patient Gender: F           HR:           103 bpm. Exam Location:  [facility] Procedure: 2D Echo, Cardiac Doppler and Color Doppler Indications:     I48.92 Atrial Flutter  History:         Patient has no prior history of Echocardiogram examinations.                  Risk Factors:Hypertension.  Sonographer:     Daphine Deutscher RDCS Referring Phys:  [id number] Wilfred Curtis FIEP Diagnosing Phys: Marcina Millard MD IMPRESSIONS  1. Left ventricular ejection fraction, by estimation, is 55 to 60%. The left ventricle has normal function. The left ventricle has no regional wall motion abnormalities. Indeterminate diastolic filling due to E-A fusion.  2. Right ventricular systolic function is normal. The right ventricular size is normal.  3. The mitral valve is normal in structure. Moderate mitral valve regurgitation. No evidence of mitral stenosis.  4. Tricuspid valve regurgitation is mild to moderate.  5. The aortic valve is  normal in structure. Aortic valve regurgitation is not visualized. No aortic stenosis is present.  6. The inferior vena cava is normal in size with greater than 50% respiratory variability, suggesting right atrial pressure of 3 mmHg. FINDINGS  Left Ventricle: Left ventricular ejection fraction, by estimation, is 55 to 60%. The left ventricle has normal function. The left ventricle has no regional wall motion abnormalities. The left ventricular internal cavity size was normal in size. There is  no left ventricular hypertrophy. Indeterminate  diastolic filling due to E-A fusion. Right Ventricle: The right ventricular size is normal. No increase in right ventricular wall thickness. Right ventricular systolic function is normal. Left Atrium: Left atrial size was normal in size. Right Atrium: Right atrial size was normal in size. Pericardium: There is no evidence of pericardial effusion. Mitral Valve: The mitral valve is normal in structure. Moderate mitral valve regurgitation. No evidence of mitral valve stenosis. Tricuspid Valve: The tricuspid valve is normal in structure. Tricuspid valve regurgitation is mild to moderate. No evidence of tricuspid stenosis. Aortic Valve: The aortic valve is normal in structure. Aortic valve regurgitation is not visualized. No aortic stenosis is present. Pulmonic Valve: The pulmonic valve was normal in structure. Pulmonic valve regurgitation is not visualized. No evidence of pulmonic stenosis. Aorta: The aortic root is normal in size and structure. Venous: The inferior vena cava is normal in size with greater than 50% respiratory variability, suggesting right atrial pressure of 3 mmHg. IAS/Shunts: No atrial level shunt detected by color flow Doppler.  LEFT VENTRICLE PLAX 2D LVIDd:         3.80 cm LVIDs:         2.70 cm LV PW:         0.90 cm LV IVS:        0.90 cm LVOT diam:     1.50 cm LV SV:         23 LV SV Index:   14 LVOT Area:     1.77 cm  RIGHT VENTRICLE             IVC RV Basal  diam:  3.10 cm     IVC diam: 1.90 cm RV S prime:     10.85 cm/s TAPSE (M-mode): 1.6 cm LEFT ATRIUM             Index        RIGHT ATRIUM           Index LA diam:        4.70 cm 2.92 cm/m   RA Area:     11.00 cm LA Vol (A2C):   59.6 ml 37.03 ml/m  RA Volume:   22.80 ml  14.17 ml/m LA Vol (A4C):   55.2 ml 34.30 ml/m LA Biplane Vol: 57.7 ml 35.85 ml/m  AORTIC VALVE LVOT Vmax:   70.23 cm/s LVOT Vmean:  51.067 cm/s LVOT VTI:    0.132 m  AORTA Ao Root diam: 3.00 cm Ao Asc diam:  3.50 cm MR Peak grad: 88.7 mmHg     TRICUSPID VALVE MR Mean grad: 66.0 mmHg     TR Peak grad:   32.5 mmHg MR Vmax:      471.00 cm/s   TR Vmax:        285.00 cm/s MR Vmean:     390.0 cm/s MV E velocity: 112.00 cm/s  SHUNTS                             Systemic VTI:  0.13 m                             Systemic Diam: 1.50 cm Marcina Millard MD Electronically signed by Marcina Millard MD Signature Date/Time: 04/01/2023/12:19:40 PM    Final    DG Chest 2 View  Result Date: 03/31/2023 CLINICAL DATA:  Tachycardia. EXAM: CHEST - 2 VIEW COMPARISON:  Chest radiographs 09/01/2017  FINDINGS: Cardiac silhouette and mediastinal contours are within limits. Moderate to high-grade atherosclerotic calcification is again seen within the aortic arch. The lungs are clear. No pleural effusion or pneumothorax. Mild flattening of the diaphragms and hyperinflation is unchanged. Moderate multilevel degenerative disc space narrowing of the visualized cervical and thoracic spine. Minimal anterior height loss of a midthoracic vertebral body is unchanged from prior. Multiple EKG leads overlie the chest. IMPRESSION: 1. No active cardiopulmonary disease. 2. Mild chronic hyperinflation, unchanged. Electronically Signed   By: Neita Garnet M.D.   On: 03/31/2023 15:44    Microbiology: No results found for this or any previous visit (from the past 240 hour(s)).   Labs: Basic Metabolic Panel: Recent Labs  Lab 03/31/23 1109 04/01/23 0423  NA 141 136  K 4.5  3.6  CL 104 106  CO2 25 23  GLUCOSE 120* 126*  BUN 19 15  CREATININE 0.63 0.62  CALCIUM 9.8 8.6*   Liver Function Tests: No results for input(s): "AST", "ALT", "ALKPHOS", "BILITOT", "PROT", "ALBUMIN" in the last 168 hours. No results for input(s): "LIPASE", "AMYLASE" in the last 168 hours. No results for input(s): "AMMONIA" in the last 168 hours. CBC: Recent Labs  Lab 03/31/23 1109 04/01/23 0423  WBC 5.9 5.1  HGB 14.9 13.6  HCT 44.0 39.6  MCV 98.0 94.3  PLT 216 202   Cardiac Enzymes: No results for input(s): "CKTOTAL", "CKMB", "CKMBINDEX", "TROPONINI" in the last 168 hours. BNP: BNP (last 3 results) No results for input(s): "BNP" in the last 8760 hours.  ProBNP (last 3 results) No results for input(s): "PROBNP" in the last 8760 hours.  CBG: No results for input(s): "GLUCAP" in the last 168 hours.     Signed:  Silvano Bilis MD.  [facility] 04/01/2023, 2:08 PM

## 2023-04-01 NOTE — CV Procedure (Signed)
Cardioversion procedure note For atrial flutter with RVR  Procedure Details:  Consent: Risks of procedure as well as the alternatives and risks of each were explained to the (patient/caregiver).  Consent for procedure obtained.  Time Out: Verified patient identification, verified procedure, site/side was marked, verified correct patient position, special equipment/implants available, medications/allergies/relevent history reviewed, required imaging and test results available.  Performed  Patient placed on cardiac monitor, pulse oximetry, supplemental oxygen as necessary.   Sedation given: propofol IV, Dr.  Donzetta Starch pads placed anterior and posterior chest.   Cardioverted 5 time(s).   Cardioverted at  150J., 150 J, 200J, pads anteroposterior Pads changed to anterolateral  200 J, 200 J with compression , synchronized biphasic Converted to NSR   Evaluation: Findings: Post procedure EKG shows: NSR Complications: None Patient did tolerate procedure well.  Time Spent Directly with the Patient:  45 minutes   Dossie Arbour, M.D., Ph.D.

## 2023-04-02 ENCOUNTER — Encounter: Payer: Self-pay | Admitting: Cardiovascular Disease

## 2023-04-02 ENCOUNTER — Other Ambulatory Visit (INDEPENDENT_AMBULATORY_CARE_PROVIDER_SITE_OTHER): Payer: Self-pay

## 2023-04-02 ENCOUNTER — Other Ambulatory Visit: Payer: Self-pay | Admitting: *Deleted

## 2023-04-02 DIAGNOSIS — I4892 Unspecified atrial flutter: Secondary | ICD-10-CM

## 2023-04-02 NOTE — Addendum Note (Signed)
Addended by: Bryna Colander on: 04/02/2023 08:39 AM   Modules accepted: Orders

## 2023-05-17 NOTE — Progress Notes (Signed)
 Electrophysiology Clinic Note    Date:  05/18/2023  Patient ID:  Claire Drake, Claire Drake 09-Dec-1940, MRN [id number] PCP:  Sherial Bail, MD  Cardiologist:  None Electrophysiologist: OLE ONEIDA HOLTS, MD    Discussed the use of AI scribe software for clinical note transcription with the patient, who gave verbal consent to proceed.   Patient Profile    Chief Complaint: Aflutter hospital follow-up  History of Present Illness: Claire Drake is a 84 y.o. female with PMH notable for aflutter, HTN, osteoporosis, pre-diabetes, hyperparathyroidism; seen today for OLE ONEIDA HOLTS, MD for post hospital follow up.    Admitted 11/19-20/2024. She presented to ER from PCP office for asymptomatic aflutter, newly diagnosed. She was TEE/DCCV 11/20, discharged on eliquis  and toprol .   On follow-up today, she feels well and denies any symptoms of palpitations, chest pain, shortness of breath, or dizziness. She does not recall the events leading up to her hospitalization and is unsure of the reason for her visit today.  Her son, who joins for visit, states that patient has dementia and is at her neurological baseline.  She confirms she is taking eliquis  and toprol  BID, no missed doses, no bleeding concerns.     AAD History: None     ROS:  Please see the history of present illness. All other systems are reviewed and otherwise negative.    Physical Exam    VS:  BP (!) 148/98   Pulse (!) 105   Ht 5' (1.524 m)   Wt 139 lb (63 kg)   SpO2 98%   BMI 27.15 kg/m  BMI: Body mass index is 27.15 kg/m.  Wt Readings from Last 3 Encounters:  05/18/23 139 lb (63 kg)  03/31/23 138 lb 4.8 oz (62.7 kg)  08/21/22 141 lb (64 kg)     GEN- The patient is well appearing, alert and oriented x 3 today, poor historian Lungs- Clear to ausculation bilaterally, normal work of breathing.  Heart- Irregularly irregular rate and rhythm, no murmurs, rubs or gallops Extremities- No peripheral edema, warm,  dry    Studies Reviewed   Previous EP, cardiology notes.    EKG is ordered. Personal review of EKG from today shows:    EKG Interpretation Date/Time:  Monday May 18 2023 14:03:09 EST Ventricular Rate:  105 PR Interval:    QRS Duration:  72 QT Interval:  354 QTC Calculation: 467 R Axis:   -23  Text Interpretation: Atrial fibrillation with rapid ventricular response Confirmed by Rakeya Glab [phone]) on 05/18/2023 2:29:02 PM    TEE, 04/01/2023  1. Left ventricular ejection fraction, by estimation, is 60 to 65%. The left ventricle has normal function. The left ventricle has no regional wall motion abnormalities.   2. Right ventricular systolic function is normal. The right ventricular size is normal.   3. No left atrial/left atrial appendage thrombus was detected.   4. The mitral valve is normal in structure. Mild to moderate mitral valve regurgitation. No evidence of mitral stenosis.   5. The aortic valve is tricuspid. Aortic valve regurgitation is not visualized. No aortic stenosis is present.   6. The inferior vena cava is normal in size with greater than 50% respiratory variability, suggesting right atrial pressure of 3 mmHg.   7. Agitated saline contrast bubble study was negative, with no evidence of any interatrial shunt.   Conclusion(s)/Recommendation(s): Normal biventricular function without evidence of hemodynamically significant valvular heart disease.   TTE, 03/31/2023  1. Left ventricular ejection fraction,  by estimation, is 55 to 60%. The left ventricle has normal function. The left ventricle has no regional wall motion abnormalities. Indeterminate diastolic filling due to E-A fusion.   2. Right ventricular systolic function is normal. The right ventricular size is normal.   3. The mitral valve is normal in structure. Moderate mitral valve regurgitation. No evidence of mitral stenosis.   4. Tricuspid valve regurgitation is mild to moderate.   5. The aortic valve is  normal in structure. Aortic valve regurgitation is not visualized. No aortic stenosis is present.   6. The inferior vena cava is normal in size with greater than 50% respiratory variability, suggesting right atrial pressure of 3 mmHg.      Assessment and Plan     #) Atrial flutter #) AFib S/p TEE guided DCCV 03/2023. Converted to AFib at some point prior to today's visit. She is unsure when she converted. She was asymptomatic at presentation to ER for aflutter. Continues to be asymptomatic with irregular rhythm noted on today's visit.  Recent TTE with preserved LVEF -Continue Eliquis  and Metoprolol  as prescribed. -Advise patient to monitor blood pressure and pulse at home a few times a week and document readings along with any cardiac symptoms to confirm that she is asymptomatic of arrhythmia   #) Hypercoag d/t atrial flutter CHA2DS2-VASc Score = at least 5 [CHF History: 0, HTN History: 1, Diabetes History: 1, Stroke History: 0, Vascular Disease History: 0, Age Score: 2, Gender Score: 1].  Therefore, the patient's annual risk of stroke is 7.2 %.    Stroke ppx - 5mg  eliquis  BID, appropriately dosed No bleeding concerns  #) HTN Elevated in office today Encouraged to obtain a BP cuff as above  #) dementia Continue to follow-up with PCP      Current medicines are reviewed at length with the patient today.   The patient does not have concerns regarding her medicines.  The following changes were made today:  none  Labs/ tests ordered today include:  Orders Placed This Encounter  Procedures   EKG 12-Lead     Disposition: Follow up with Dr. Cindie or EP APP  6 weeks    Signed, Chantal Needle, NP  05/18/23  4:09 PM  Electrophysiology [facility]

## 2023-05-18 ENCOUNTER — Ambulatory Visit: Payer: Medicare PPO | Attending: Cardiology | Admitting: Cardiology

## 2023-05-18 ENCOUNTER — Encounter: Payer: Self-pay | Admitting: Cardiology

## 2023-05-18 VITALS — BP 148/98 | HR 105 | Ht 60.0 in | Wt 139.0 lb

## 2023-05-18 DIAGNOSIS — D6869 Other thrombophilia: Secondary | ICD-10-CM | POA: Diagnosis not present

## 2023-05-18 DIAGNOSIS — I4892 Unspecified atrial flutter: Secondary | ICD-10-CM

## 2023-05-18 DIAGNOSIS — I10 Essential (primary) hypertension: Secondary | ICD-10-CM

## 2023-05-18 NOTE — Patient Instructions (Signed)
 Medication Instructions:   Your physician recommends that you continue on your current medications as directed. Please refer to the Current Medication list given to you today.   *If you need a refill on your cardiac medications before your next appointment, please call your pharmacy*   Lab Work:  None Ordered  If you have labs (blood work) drawn today and your tests are completely normal, you will receive your results only by: MyChart Message (if you have MyChart) OR A paper copy in the mail If you have any lab test that is abnormal or we need to change your treatment, we will call you to review the results.   Testing/Procedures:  None Ordered    Follow-Up: At [facility], you and your health needs are our priority.  As part of our continuing mission to provide you with exceptional heart care, we have created designated Provider Care Teams.  These Care Teams include your primary Cardiologist (physician) and Advanced Practice Providers (APPs -  Physician Assistants and Nurse Practitioners) who all work together to provide you with the care you need, when you need it.  We recommend signing up for the patient portal called MyChart.  Sign up information is provided on this After Visit Summary.  MyChart is used to connect with patients for Virtual Visits (Telemedicine).  Patients are able to view lab/test results, encounter notes, upcoming appointments, etc.  Non-urgent messages can be sent to your provider as well.   To learn more about what you can do with MyChart, go to [email or web address].    Your next appointment:   6 week(s)  Provider:   Suzann Riddle, NP ONLY   Other Instructions  -  please check blood pressure and pulse 3 times a week - write them down along with any symptoms.

## 2023-06-09 ENCOUNTER — Other Ambulatory Visit: Payer: Self-pay | Admitting: Obstetrics and Gynecology

## 2023-06-29 NOTE — Progress Notes (Unsigned)
Electrophysiology Clinic Note    Date:  06/30/2023  Patient ID:  Claire Drake, Claire Drake Jun 12, 1940, MRN [id number] PCP:  Enid Baas, MD  Cardiologist:  None Electrophysiologist: Lanier Prude, MD    Discussed the use of AI scribe software for clinical note transcription with the patient, who gave verbal consent to proceed.   Patient Profile    Chief Complaint: AFib, Aflutter follow-up  History of Present Illness: Claire Drake is a 83 y.o. female with PMH notable for aflutter, HTN, osteoporosis, pre-diabetes, hyperparathyroidism; seen today for Lanier Prude, MD for routine follow-up.   Admitted 11/19-20/2024. She presented to ER from PCP office for asymptomatic aflutter, newly diagnosed. She was TEE/DCCV 11/20, discharged on eliquis and toprol. I saw her in follow-up 05/2023 where she had converted back to AFib and was completely asymptomatic. We had discussion about whether to pursue AFib treatments and decided to continue to monitor. She saw PCP last week where HR was 105 with persistent hypertension by home monitor, and cardizem was started.   On follow-up today, she continues to be asymptomatic from AFib perspective. Denies chest pain, chest pressure, palpitations, or SOB.  Her BP log continues to show elevated readings since the addition of cardizem with 150-160/90-110s. She continues to take eliquis BID, no missed doses, no bleeding concerns. She does have a bruise to her lower shin from running into a step.   Her son joins for appt, whom I have met before    AAD History: None     ROS:  Please see the history of present illness. All other systems are reviewed and otherwise negative.    Physical Exam    VS:  BP 130/78 (BP Location: Left Arm, Patient Position: Sitting, Cuff Size: Normal)   Pulse 84   Ht 5' (1.524 m)   Wt 144 lb 4 oz (65.4 kg)   SpO2 97%   BMI 28.17 kg/m  BMI: Body mass index is 28.17 kg/m.  Wt Readings from Last 3 Encounters:   06/30/23 144 lb 4 oz (65.4 kg)  05/18/23 139 lb (63 kg)  03/31/23 138 lb 4.8 oz (62.7 kg)     GEN- The patient is well appearing, alert and oriented x 3 today, poor historian Lungs- Clear to ausculation bilaterally, normal work of breathing.  Heart- Irregularly irregular rate and rhythm, no murmurs, rubs or gallops Extremities- No peripheral edema, warm, dry    Studies Reviewed   Previous EP, cardiology notes.    EKG is ordered. Personal review of EKG from today shows:    EKG Interpretation Date/Time:  Tuesday June 30 2023 09:22:32 EST Ventricular Rate:  84 PR Interval:    QRS Duration:  66 QT Interval:  378 QTC Calculation: 446 R Axis:   -18  Text Interpretation: Atrial fibrillation Confirmed by [name] [phone]) on 06/30/2023 9:25:25 AM    TEE, 04/01/2023  1. Left ventricular ejection fraction, by estimation, is 60 to 65%. The left ventricle has normal function. The left ventricle has no regional wall motion abnormalities.   2. Right ventricular systolic function is normal. The right ventricular size is normal.   3. No left atrial/left atrial appendage thrombus was detected.   4. The mitral valve is normal in structure. Mild to moderate mitral valve regurgitation. No evidence of mitral stenosis.   5. The aortic valve is tricuspid. Aortic valve regurgitation is not visualized. No aortic stenosis is present.   6. The inferior vena cava is normal in size  with greater than 50% respiratory variability, suggesting right atrial pressure of 3 mmHg.   7. Agitated saline contrast bubble study was negative, with no evidence of any interatrial shunt.   Conclusion(s)/Recommendation(s): Normal biventricular function without evidence of hemodynamically significant valvular heart disease.   TTE, 03/31/2023  1. Left ventricular ejection fraction, by estimation, is 55 to 60%. The left ventricle has normal function. The left ventricle has no regional wall motion abnormalities.  Indeterminate diastolic filling due to E-A fusion.   2. Right ventricular systolic function is normal. The right ventricular size is normal.   3. The mitral valve is normal in structure. Moderate mitral valve regurgitation. No evidence of mitral stenosis.   4. Tricuspid valve regurgitation is mild to moderate.   5. The aortic valve is normal in structure. Aortic valve regurgitation is not visualized. No aortic stenosis is present.   6. The inferior vena cava is normal in size with greater than 50% respiratory variability, suggesting right atrial pressure of 3 mmHg.      Assessment and Plan     #) Atrial flutter #) AFib S/p TEE guided DCCV 03/2023 with ERAF Preserved LVEF on recent echo She remains asymptomatic of arrhythmia Continue 25mg  toprol BID Continue 240mg  dilt daily Goal HR 60-110s   #) Hypercoag d/t atrial flutter CHA2DS2-VASc Score = at least 5 [CHF History: 0, HTN History: 1, Diabetes History: 1, Stroke History: 0, Vascular Disease History: 0, Age Score: 2, Gender Score: 1].  Therefore, the patient's annual risk of stroke is 7.2 %.    Stroke ppx - 5mg  eliquis BID, appropriately dosed No bleeding concerns  #) HTN Elevated in office today, though considerably lower than home readings I query whether home cuff is accurate. Recommend bring home cuff to follow-up appt with PCP to ensure accuracy She has close follow-up with PCP in ~24month to continue to monitor BP      Current medicines are reviewed at length with the patient today.   The patient does not have concerns regarding her medicines.  The following changes were made today:  none  Labs/ tests ordered today include:  Orders Placed This Encounter  Procedures   EKG 12-Lead     Disposition: Follow up with Dr. Lalla Brothers or EP APP in 6 months, sooner if needed   Signed, [name], NP  06/30/23  9:57 AM  Electrophysiology [facility]

## 2023-06-30 ENCOUNTER — Encounter: Payer: Self-pay | Admitting: Cardiology

## 2023-06-30 ENCOUNTER — Ambulatory Visit: Payer: Medicare PPO | Attending: Cardiology | Admitting: Cardiology

## 2023-06-30 VITALS — BP 130/78 | HR 84 | Ht 60.0 in | Wt 144.2 lb

## 2023-06-30 DIAGNOSIS — I4892 Unspecified atrial flutter: Secondary | ICD-10-CM

## 2023-06-30 DIAGNOSIS — I10 Essential (primary) hypertension: Secondary | ICD-10-CM | POA: Diagnosis not present

## 2023-06-30 DIAGNOSIS — D6869 Other thrombophilia: Secondary | ICD-10-CM

## 2023-06-30 NOTE — Patient Instructions (Signed)
 Medication Instructions:  The current medical regimen is effective;  continue present plan and medications.  *If you need a refill on your cardiac medications before your next appointment, please call your pharmacy*   Follow-Up: At [facility], you and your health needs are our priority.  As part of our continuing mission to provide you with exceptional heart care, we have created designated Provider Care Teams.  These Care Teams include your primary Cardiologist (physician) and Advanced Practice Providers (APPs -  Physician Assistants and Nurse Practitioners) who all work together to provide you with the care you need, when you need it.  We recommend signing up for the patient portal called "MyChart".  Sign up information is provided on this After Visit Summary.  MyChart is used to connect with patients for Virtual Visits (Telemedicine).  Patients are able to view lab/test results, encounter notes, upcoming appointments, etc.  Non-urgent messages can be sent to your provider as well.   To learn more about what you can do with MyChart, go to [email or web address].    Your next appointment:   6 month(s)  Provider:   [name], NP

## 2023-07-10 ENCOUNTER — Other Ambulatory Visit: Payer: Self-pay | Admitting: Obstetrics and Gynecology

## 2023-11-09 ENCOUNTER — Other Ambulatory Visit: Payer: Self-pay | Admitting: Cardiology

## 2023-11-09 DIAGNOSIS — I4892 Unspecified atrial flutter: Secondary | ICD-10-CM

## 2023-11-11 ENCOUNTER — Ambulatory Visit: Payer: Self-pay | Admitting: Cardiology

## 2023-11-21 DIAGNOSIS — I4892 Unspecified atrial flutter: Secondary | ICD-10-CM | POA: Diagnosis not present

## 2023-11-22 ENCOUNTER — Ambulatory Visit: Payer: Self-pay | Admitting: Cardiology
